# Patient Record
Sex: Female | Born: 1980 | Hispanic: Yes | Marital: Married | Smoking: Never smoker
Health system: Southern US, Community
[De-identification: ages and names within clinical notes are randomized; demographics above are authoritative.]

## PROBLEM LIST (undated history)

## (undated) DIAGNOSIS — I050 Rheumatic mitral stenosis: Secondary | ICD-10-CM

## (undated) DIAGNOSIS — R002 Palpitations: Secondary | ICD-10-CM

## (undated) DIAGNOSIS — I272 Pulmonary hypertension, unspecified: Secondary | ICD-10-CM

## (undated) DIAGNOSIS — K8020 Calculus of gallbladder without cholecystitis without obstruction: Secondary | ICD-10-CM

## (undated) HISTORY — PX: CARDIAC VALVE SURGERY: SHX40

## (undated) HISTORY — DX: Calculus of gallbladder without cholecystitis without obstruction: K80.20

## (undated) HISTORY — PX: OVARIAN CYST REMOVAL: SHX89

## (undated) HISTORY — PX: MITRAL VALVULOPLASTY: SHX143

## (undated) HISTORY — DX: Pulmonary hypertension, unspecified: I27.20

## (undated) HISTORY — DX: Rheumatic mitral stenosis: I05.0

## (undated) HISTORY — PX: CHOLECYSTECTOMY: SHX55

## (undated) HISTORY — DX: Palpitations: R00.2

---

## 2000-09-21 ENCOUNTER — Emergency Department (HOSPITAL_COMMUNITY): Admission: EM | Admit: 2000-09-21 | Discharge: 2000-09-21 | Payer: Self-pay | Admitting: Emergency Medicine

## 2001-05-22 ENCOUNTER — Inpatient Hospital Stay (HOSPITAL_COMMUNITY): Admission: EM | Admit: 2001-05-22 | Discharge: 2001-05-27 | Payer: Self-pay | Admitting: *Deleted

## 2001-05-22 ENCOUNTER — Encounter: Payer: Self-pay | Admitting: *Deleted

## 2001-05-26 ENCOUNTER — Encounter: Payer: Self-pay | Admitting: Internal Medicine

## 2001-05-27 ENCOUNTER — Encounter: Payer: Self-pay | Admitting: Internal Medicine

## 2001-06-20 ENCOUNTER — Encounter: Payer: Self-pay | Admitting: Internal Medicine

## 2001-06-20 ENCOUNTER — Inpatient Hospital Stay (HOSPITAL_COMMUNITY): Admission: EM | Admit: 2001-06-20 | Discharge: 2001-06-24 | Payer: Self-pay | Admitting: Emergency Medicine

## 2001-08-04 ENCOUNTER — Encounter: Admission: RE | Admit: 2001-08-04 | Discharge: 2001-08-04 | Payer: Self-pay | Admitting: Internal Medicine

## 2001-08-18 ENCOUNTER — Encounter: Admission: RE | Admit: 2001-08-18 | Discharge: 2001-08-18 | Payer: Self-pay | Admitting: Internal Medicine

## 2002-03-24 ENCOUNTER — Encounter: Admission: RE | Admit: 2002-03-24 | Discharge: 2002-03-24 | Payer: Self-pay | Admitting: *Deleted

## 2002-03-31 ENCOUNTER — Encounter: Admission: RE | Admit: 2002-03-31 | Discharge: 2002-03-31 | Payer: Self-pay | Admitting: *Deleted

## 2002-04-14 ENCOUNTER — Encounter: Admission: RE | Admit: 2002-04-14 | Discharge: 2002-04-14 | Payer: Self-pay | Admitting: *Deleted

## 2002-04-28 ENCOUNTER — Encounter: Admission: RE | Admit: 2002-04-28 | Discharge: 2002-04-28 | Payer: Self-pay | Admitting: *Deleted

## 2002-04-30 ENCOUNTER — Ambulatory Visit (HOSPITAL_COMMUNITY): Admission: RE | Admit: 2002-04-30 | Discharge: 2002-04-30 | Payer: Self-pay | Admitting: Internal Medicine

## 2002-05-12 ENCOUNTER — Encounter: Admission: RE | Admit: 2002-05-12 | Discharge: 2002-05-12 | Payer: Self-pay | Admitting: *Deleted

## 2002-05-26 ENCOUNTER — Encounter: Admission: RE | Admit: 2002-05-26 | Discharge: 2002-05-26 | Payer: Self-pay | Admitting: *Deleted

## 2002-06-09 ENCOUNTER — Encounter: Admission: RE | Admit: 2002-06-09 | Discharge: 2002-06-09 | Payer: Self-pay | Admitting: *Deleted

## 2002-06-23 ENCOUNTER — Encounter: Admission: RE | Admit: 2002-06-23 | Discharge: 2002-06-23 | Payer: Self-pay | Admitting: *Deleted

## 2002-07-07 ENCOUNTER — Encounter: Admission: RE | Admit: 2002-07-07 | Discharge: 2002-07-07 | Payer: Self-pay | Admitting: *Deleted

## 2002-07-14 ENCOUNTER — Encounter: Admission: RE | Admit: 2002-07-14 | Discharge: 2002-07-14 | Payer: Self-pay | Admitting: *Deleted

## 2002-07-28 ENCOUNTER — Encounter: Admission: RE | Admit: 2002-07-28 | Discharge: 2002-07-28 | Payer: Self-pay | Admitting: *Deleted

## 2002-08-05 ENCOUNTER — Encounter: Admission: RE | Admit: 2002-08-05 | Discharge: 2002-08-05 | Payer: Self-pay | Admitting: Family Medicine

## 2002-08-12 ENCOUNTER — Encounter: Admission: RE | Admit: 2002-08-12 | Discharge: 2002-08-12 | Payer: Self-pay | Admitting: Family Medicine

## 2002-08-13 ENCOUNTER — Ambulatory Visit (HOSPITAL_COMMUNITY): Admission: RE | Admit: 2002-08-13 | Discharge: 2002-08-13 | Payer: Self-pay | Admitting: Obstetrics and Gynecology

## 2002-08-19 ENCOUNTER — Encounter: Admission: RE | Admit: 2002-08-19 | Discharge: 2002-08-19 | Payer: Self-pay | Admitting: Family Medicine

## 2002-08-23 ENCOUNTER — Encounter: Admission: RE | Admit: 2002-08-23 | Discharge: 2002-11-21 | Payer: Self-pay | Admitting: *Deleted

## 2002-08-26 ENCOUNTER — Encounter: Admission: RE | Admit: 2002-08-26 | Discharge: 2002-08-26 | Payer: Self-pay | Admitting: Family Medicine

## 2002-09-02 ENCOUNTER — Encounter: Admission: RE | Admit: 2002-09-02 | Discharge: 2002-09-02 | Payer: Self-pay | Admitting: *Deleted

## 2002-09-09 ENCOUNTER — Encounter: Admission: RE | Admit: 2002-09-09 | Discharge: 2002-09-09 | Payer: Self-pay | Admitting: *Deleted

## 2002-09-16 ENCOUNTER — Encounter: Admission: RE | Admit: 2002-09-16 | Discharge: 2002-09-16 | Payer: Self-pay | Admitting: *Deleted

## 2002-09-23 ENCOUNTER — Encounter: Admission: RE | Admit: 2002-09-23 | Discharge: 2002-09-23 | Payer: Self-pay | Admitting: Family Medicine

## 2002-09-30 ENCOUNTER — Ambulatory Visit (HOSPITAL_COMMUNITY): Admission: RE | Admit: 2002-09-30 | Discharge: 2002-09-30 | Payer: Self-pay | Admitting: *Deleted

## 2002-09-30 ENCOUNTER — Encounter: Payer: Self-pay | Admitting: *Deleted

## 2002-09-30 ENCOUNTER — Inpatient Hospital Stay (HOSPITAL_COMMUNITY): Admission: AD | Admit: 2002-09-30 | Discharge: 2002-10-04 | Payer: Self-pay | Admitting: Obstetrics & Gynecology

## 2002-09-30 ENCOUNTER — Encounter: Admission: RE | Admit: 2002-09-30 | Discharge: 2002-09-30 | Payer: Self-pay | Admitting: *Deleted

## 2004-04-09 ENCOUNTER — Ambulatory Visit: Payer: Self-pay

## 2004-04-09 ENCOUNTER — Ambulatory Visit: Payer: Self-pay | Admitting: Internal Medicine

## 2004-07-05 ENCOUNTER — Inpatient Hospital Stay (HOSPITAL_COMMUNITY): Admission: AD | Admit: 2004-07-05 | Discharge: 2004-07-05 | Payer: Self-pay | Admitting: Obstetrics

## 2004-08-30 ENCOUNTER — Ambulatory Visit: Payer: Self-pay

## 2004-09-06 ENCOUNTER — Ambulatory Visit: Payer: Self-pay | Admitting: Cardiology

## 2004-09-08 ENCOUNTER — Inpatient Hospital Stay (HOSPITAL_COMMUNITY): Admission: AD | Admit: 2004-09-08 | Discharge: 2004-09-11 | Payer: Self-pay | Admitting: Obstetrics

## 2010-08-07 ENCOUNTER — Emergency Department (HOSPITAL_COMMUNITY)
Admission: EM | Admit: 2010-08-07 | Discharge: 2010-08-08 | Disposition: A | Discharge status: Home / Self Care | Payer: Self-pay | Attending: Emergency Medicine | Admitting: Emergency Medicine

## 2010-08-07 DIAGNOSIS — R002 Palpitations: Secondary | ICD-10-CM | POA: Insufficient documentation

## 2010-08-07 DIAGNOSIS — I440 Atrioventricular block, first degree: Secondary | ICD-10-CM | POA: Insufficient documentation

## 2010-08-07 DIAGNOSIS — I479 Paroxysmal tachycardia, unspecified: Secondary | ICD-10-CM | POA: Insufficient documentation

## 2010-08-07 LAB — BASIC METABOLIC PANEL
BUN: 6 mg/dL (ref 6–23)
Chloride: 103 mEq/L (ref 96–112)
Creatinine, Ser: 0.51 mg/dL (ref 0.4–1.2)
Glucose, Bld: 89 mg/dL (ref 70–99)

## 2010-08-07 LAB — POCT CARDIAC MARKERS

## 2010-08-07 LAB — CBC
MCH: 28.8 pg (ref 26.0–34.0)
MCV: 86.3 fL (ref 78.0–100.0)
Platelets: 178 10*3/uL (ref 150–400)
RBC: 4.37 MIL/uL (ref 3.87–5.11)
RDW: 13.1 % (ref 11.5–15.5)

## 2010-08-07 LAB — DIFFERENTIAL
Eosinophils Absolute: 0 10*3/uL (ref 0.0–0.7)
Eosinophils Relative: 0 % (ref 0–5)
Lymphs Abs: 2.4 10*3/uL (ref 0.7–4.0)
Monocytes Relative: 9 % (ref 3–12)

## 2010-08-08 ENCOUNTER — Encounter: Payer: Self-pay | Admitting: *Deleted

## 2010-08-08 ENCOUNTER — Encounter: Payer: Self-pay | Admitting: Cardiovascular Disease

## 2010-08-08 LAB — POCT CARDIAC MARKERS

## 2010-08-09 ENCOUNTER — Ambulatory Visit (INDEPENDENT_AMBULATORY_CARE_PROVIDER_SITE_OTHER): Payer: Self-pay | Admitting: Internal Medicine

## 2010-08-09 ENCOUNTER — Encounter: Payer: Self-pay | Admitting: Internal Medicine

## 2010-08-09 VITALS — BP 90/64 | HR 73 | Resp 18 | Ht 62.0 in | Wt 150.0 lb

## 2010-08-09 DIAGNOSIS — I050 Rheumatic mitral stenosis: Secondary | ICD-10-CM

## 2010-08-09 DIAGNOSIS — I059 Rheumatic mitral valve disease, unspecified: Secondary | ICD-10-CM

## 2010-08-09 DIAGNOSIS — I499 Cardiac arrhythmia, unspecified: Secondary | ICD-10-CM

## 2010-08-09 MED ORDER — METOPROLOL TARTRATE 25 MG PO TABS: ORAL_TABLET | ORAL | Status: DC

## 2010-08-09 NOTE — Patient Instructions (Addendum)
Your physician has requested that you have an echocardiogram. Echocardiography is a painless test that uses sound waves to create images of your heart. It provides your doctor with information about the size and shape of your heart and how well your heart's chambers and valves are working. This procedure takes approximately one hour. There are no restrictions for this procedure. We will call you with results  Take Lopressor 25mg  as needed for palpitations.  Call Dr.Ross if having to take Lopressor.

## 2010-08-12 DIAGNOSIS — I050 Rheumatic mitral stenosis: Secondary | ICD-10-CM | POA: Insufficient documentation

## 2010-08-12 DIAGNOSIS — I499 Cardiac arrhythmia, unspecified: Secondary | ICD-10-CM | POA: Insufficient documentation

## 2010-08-12 NOTE — Progress Notes (Signed)
HPI Patient is a 30 year old who was referred for evaluation of tachycardia. On Tuesday the patient took her children to the bus and came home.  Around 7:30 her heart began racing.  She became dizzy but did not get close to fainting.  No SOB.  No chest pain.  Tachcardia would come and go through the day.  She laid on the floor for most of the day.  When her husband came home she went to the urgent care and from there was sent to the emergency room. This was the first episode she had .  EKG in the Urgent care showed a narrow complex tachycardia.  By the time she got to the ER the episode had resolved. She has not had any other since Otherwise, she denies SOB.  No chest pain.  No dizziness. Note that the patient was seen remotely in clinic .  She has a history of mitral stenosis and underwent MV valvuloplasty at [facility].  She has not been seen  In f/u. No Known Allergies  Current Outpatient Prescriptions  Medication Sig Dispense Refill  . metoprolol tartrate (LOPRESSOR) 25 MG tablet Take one tablet as needed for palpitations  30 tablet  1    Past Medical History  Diagnosis Date  . Mitral stenosis   . Pulmonary hypertension     Improved following mitral valvuloplasty  . Palpitations     Past Surgical History  Procedure Date  . Mitral valvuloplasty   . Cardiac valve surgery     No family history on file.  History   Social History  . Marital Status: Married    Spouse Name: N/A    Number of Children: N/A  . Years of Education: N/A   Occupational History  . Not on file.   Social History Main Topics  . Smoking status: Never Smoker   . Smokeless tobacco: Not on file  . Alcohol Use: No  . Drug Use: No  . Sexually Active: Not on file   Other Topics Concern  . Not on file   Social History Narrative  . No narrative on file    Review of Systems:  All systems reviewed.  They are negative to the above problem except as previously stated.  Vital Signs: BP 90/64  Pulse 73  Resp  18  Ht 5\' 2"  (1.575 m)  Wt 150 lb (68.04 kg)  BMI 27.44 kg/m2  Physical Exam Patient is in NAD   HEENT:  Normocephalic, atraumatic. EOMI, PERRLA.  Neck: JVP is normal. No thyromegaly. No bruits.  Lungs: clear to auscultation. No rales no wheezes.  Heart: Regular rate and rhythm. Normal S1, S2. No S3.   No significant murmurs. PMI not displaced.No RV heave  Abdomen:  Supple, nontender. Normal bowel sounds. No masses. No hepatomegaly.  Extremities:   Good distal pulses throughout. No lower extremity edema.  Musculoskeletal :moving all extremities.  Neuro:   alert and oriented x3.  CN II-XII grossly intact.  EKG:  SR.  First degree AV block.  Possible LA abnormality. Assessment and Plan:

## 2010-08-12 NOTE — Assessment & Plan Note (Signed)
Patient s/p valuloplasty  Exam is really unremarkable.  I would recommend an echo to evaluate.

## 2010-08-12 NOTE — Assessment & Plan Note (Signed)
This is the first spell the patient had .  I have given her and rx for b blocker to use as needed for recurrence. I will review with EP.

## 2010-08-20 ENCOUNTER — Ambulatory Visit (HOSPITAL_COMMUNITY): Payer: Self-pay | Attending: Cardiology

## 2010-08-20 DIAGNOSIS — R002 Palpitations: Secondary | ICD-10-CM | POA: Insufficient documentation

## 2010-08-20 DIAGNOSIS — I059 Rheumatic mitral valve disease, unspecified: Secondary | ICD-10-CM

## 2010-08-20 DIAGNOSIS — I379 Nonrheumatic pulmonary valve disorder, unspecified: Secondary | ICD-10-CM | POA: Insufficient documentation

## 2010-08-20 DIAGNOSIS — I080 Rheumatic disorders of both mitral and aortic valves: Secondary | ICD-10-CM | POA: Insufficient documentation

## 2010-08-20 DIAGNOSIS — I079 Rheumatic tricuspid valve disease, unspecified: Secondary | ICD-10-CM | POA: Insufficient documentation

## 2010-08-20 DIAGNOSIS — R000 Tachycardia, unspecified: Secondary | ICD-10-CM | POA: Insufficient documentation

## 2010-08-20 DIAGNOSIS — I050 Rheumatic mitral stenosis: Secondary | ICD-10-CM

## 2010-08-30 ENCOUNTER — Encounter: Payer: Self-pay | Admitting: Internal Medicine

## 2012-05-18 ENCOUNTER — Other Ambulatory Visit (HOSPITAL_COMMUNITY): Payer: Self-pay | Admitting: Nurse Practitioner

## 2012-05-18 DIAGNOSIS — Z3689 Encounter for other specified antenatal screening: Secondary | ICD-10-CM

## 2012-05-18 LAB — CYTOLOGY - PAP: Pap: NEGATIVE

## 2012-05-18 LAB — OB RESULTS CONSOLE HIV ANTIBODY (ROUTINE TESTING): HIV: NONREACTIVE

## 2012-05-18 LAB — OB RESULTS CONSOLE HGB/HCT, BLOOD
HCT: 38 %
Hemoglobin: 12.8 g/dL

## 2012-05-18 LAB — OB RESULTS CONSOLE HEPATITIS B SURFACE ANTIGEN: Hepatitis B Surface Ag: NEGATIVE

## 2012-05-18 LAB — OB RESULTS CONSOLE RPR: RPR: NONREACTIVE

## 2012-05-18 LAB — CULTURE, OB URINE: Urine Culture, OB: NEGATIVE

## 2012-05-18 LAB — OB RESULTS CONSOLE ANTIBODY SCREEN: Antibody Screen: NEGATIVE

## 2012-05-18 LAB — OB RESULTS CONSOLE GC/CHLAMYDIA: Gonorrhea: POSITIVE

## 2012-05-20 ENCOUNTER — Ambulatory Visit (HOSPITAL_COMMUNITY)
Admission: RE | Admit: 2012-05-20 | Discharge: 2012-05-20 | Disposition: A | Discharge status: Home / Self Care | Payer: Self-pay | Source: Ambulatory Visit | Attending: Nurse Practitioner | Admitting: Nurse Practitioner

## 2012-05-20 ENCOUNTER — Encounter (HOSPITAL_COMMUNITY): Payer: Self-pay

## 2012-05-20 DIAGNOSIS — Z3689 Encounter for other specified antenatal screening: Secondary | ICD-10-CM

## 2012-05-20 DIAGNOSIS — Z1389 Encounter for screening for other disorder: Secondary | ICD-10-CM | POA: Insufficient documentation

## 2012-05-20 DIAGNOSIS — Z363 Encounter for antenatal screening for malformations: Secondary | ICD-10-CM | POA: Insufficient documentation

## 2012-05-20 DIAGNOSIS — O358XX0 Maternal care for other (suspected) fetal abnormality and damage, not applicable or unspecified: Secondary | ICD-10-CM | POA: Insufficient documentation

## 2012-05-21 LAB — GLUCOSE TOLERANCE, 3 HOURS
Glucose, GTT - 2 Hour: 142 mg/dL — AB (ref ?–140)
Glucose, GTT - 3 Hour: 126 mg/dL (ref ?–140)
Glucose, GTT - Fasting: 80 mg/dL (ref 80–110)

## 2012-05-28 ENCOUNTER — Ambulatory Visit (INDEPENDENT_AMBULATORY_CARE_PROVIDER_SITE_OTHER): Payer: Self-pay | Admitting: Family Medicine

## 2012-05-28 ENCOUNTER — Encounter: Payer: Self-pay | Admitting: Family Medicine

## 2012-05-28 VITALS — BP 110/71 | Temp 97.0°F | Wt 162.0 lb

## 2012-05-28 DIAGNOSIS — O98212 Gonorrhea complicating pregnancy, second trimester: Secondary | ICD-10-CM | POA: Insufficient documentation

## 2012-05-28 DIAGNOSIS — D696 Thrombocytopenia, unspecified: Secondary | ICD-10-CM | POA: Insufficient documentation

## 2012-05-28 DIAGNOSIS — O09299 Supervision of pregnancy with other poor reproductive or obstetric history, unspecified trimester: Secondary | ICD-10-CM

## 2012-05-28 DIAGNOSIS — I499 Cardiac arrhythmia, unspecified: Secondary | ICD-10-CM

## 2012-05-28 DIAGNOSIS — N200 Calculus of kidney: Secondary | ICD-10-CM | POA: Insufficient documentation

## 2012-05-28 DIAGNOSIS — O98219 Gonorrhea complicating pregnancy, unspecified trimester: Secondary | ICD-10-CM

## 2012-05-28 DIAGNOSIS — Z8632 Personal history of gestational diabetes: Secondary | ICD-10-CM | POA: Insufficient documentation

## 2012-05-28 DIAGNOSIS — O99419 Diseases of the circulatory system complicating pregnancy, unspecified trimester: Secondary | ICD-10-CM

## 2012-05-28 DIAGNOSIS — Z8679 Personal history of other diseases of the circulatory system: Secondary | ICD-10-CM | POA: Insufficient documentation

## 2012-05-28 DIAGNOSIS — Z23 Encounter for immunization: Secondary | ICD-10-CM

## 2012-05-28 DIAGNOSIS — O0990 Supervision of high risk pregnancy, unspecified, unspecified trimester: Secondary | ICD-10-CM | POA: Insufficient documentation

## 2012-05-28 DIAGNOSIS — I050 Rheumatic mitral stenosis: Secondary | ICD-10-CM

## 2012-05-28 LAB — POCT URINALYSIS DIP (DEVICE)
Bilirubin Urine: NEGATIVE
Glucose, UA: NEGATIVE mg/dL
Hgb urine dipstick: NEGATIVE
Ketones, ur: NEGATIVE mg/dL
Specific Gravity, Urine: <=1.005 (ref 1.005–1.030)

## 2012-05-28 MED ORDER — TETANUS-DIPHTH-ACELL PERTUSSIS 5-2.5-18.5 LF-MCG/0.5 IM SUSP
0.5000 mL | Freq: Once | INTRAMUSCULAR | Status: DC
Start: 2012-05-28 — End: 2012-05-28

## 2012-05-28 NOTE — Progress Notes (Signed)
MFM consult scheduled 06/03/12 at 930 am. Cardiology appt. With Dr. Tenny Craw at [facility] scheduled 06/05/12 at 1130 am.

## 2012-05-28 NOTE — Progress Notes (Signed)
Nutrition note: 1st visit consult Pt has gained 3# @ [redacted]w[redacted]d, which is < expected. Pt reports eating 3 meals & 0-1 snacks/d. Pt reports no N&V or heartburn. Pt is taking a PNV. Pt received verbal & written education on general nutrition during pregnancy. Encouraged energy dense snacks. Disc wt gain goals of 15-25# or 0.6#/wk. Pt agrees to cont PNV & try to eat more often. Pt does not have WIC but plans to apply. Pt plans to BF. F/u in 4-6 wks Regina Reveal, MS, RD, LDN

## 2012-05-28 NOTE — Progress Notes (Signed)
S/p Flu in HD- New pt. Transferred from HD.  Needs TDaP Discussed risk of MS in pregnancy--she understands.  Risk is high at delivery--for MFM to see--consider transfer of care for delivery Cards referral--has not seen them since last year.  Supposed to have an ECHO 1/14.  Needs to apply for charity care in [facility].

## 2012-05-28 NOTE — Patient Instructions (Signed)
Embarazo - Segundo trimestre (Pregnancy - Second Trimester) El segundo trimestre del embarazo (del 3 al 6mes) es un perodo de evolucin rpida para usted y el beb. Hacia el final del sexto mes, el beb mide aproximadamente 23 cm y pesa 680 g. Comenzar a sentir los movimientos del beb entre las 18 y las 20 semanas de embarazo. Podr sentir las pataditas ("quickening en ingls"). Hay un rpido aumento de peso. Puede segregar un lquido claro (calostro) de las mamas. Quizs sienta pequeas contracciones en el vientre (tero) Esto se conoce como falso trabajo de parto o contracciones de Braxton-Hicks. Es como una prctica del trabajo de parto que se produce cuando el beb est listo para salir. Generalmente los problemas de vmitos matinales ya se han superado hacia el final del primer trimestre. Algunas mujeres desarrollan pequeas manchas oscuras (que se denominan cloasma, mscara del embarazo) en la cara que normalmente se van luego del nacimiento del beb. La exposicin al sol empeora las manchas. Puede desarrollarse acn en algunas mujeres embarazadas, y puede desaparecer en aquellas que ya tienen acn. EXAMENES PRENATALES  Durante los exmenes prenatales, deber seguir realizando pruebas de sangre, segn avance el embarazo. Estas pruebas se realizan para controlar su salud y la del beb. Tambin se realizan anlisis de sangre para conocer los niveles de hemoglobina. La anemia (bajo nivel de hemoglobina) es frecuente durante el embarazo. Para prevenirla, se administran hierro y vitaminas. Tambin se le realizarn exmenes para saber si tiene diabetes entre las 24 y las 28 semanas del embarazo. Podrn repetirle algunas de las pruebas que le hicieron previamente.  En cada visita le medirn el tamao del tero. Esto se realiza para asegurarse de que el beb est creciendo correctamente de acuerdo al estado del embarazo.  Tambin en cada visita prenatal controlarn su presin arterial. Esto se realiza  para asegurarse de que no tenga toxemia.  Se controlar su orina para asegurarse de que no tenga infecciones, diabetes o protena en la orina.  Se controlar su peso regularmente para asegurarse que el aumento ocurre al ritmo indicado. Esto se hace para asegurarse que usted y el beb tienen una evolucin normal.  En algunas ocasiones se realiza una prueba de ultrasonido para confirmar el correcto desarrollo y evolucin del beb. Esta prueba se realiza con ondas sonoras inofensivas para el beb, de modo que el profesional pueda calcular ms precisamente la fecha del parto. Algunas veces se realizan pruebas especializadas del lquido amnitico que rodea al beb. Esta prueba se denomina amniocentesis. El lquido amnitico se obtiene introduciendo una aguja en el vientre (abdomen). Se realiza para controlar los cromosomas en aquellos casos en los que existe alguna preocupacin acerca de algn problema gentico que pueda sufrir el beb. En ocasiones se lleva a cabo cerca del final del embarazo, si es necesario inducir al parto. En este caso se realiza para asegurarse que los pulmones del beb estn lo suficientemente maduros como para que pueda vivir fuera del tero. CAMBIOS QUE OCURREN EN EL SEGUNDO TRIMESTRE DEL EMBARAZO Su organismo atravesar numerosos cambios durante el embarazo. Estos pueden variar de una persona a otra. Converse con el profesional que la asiste acerca los cambios que usted note y que la preocupen.  Durante el segundo trimestre probablemente sienta un aumento del apetito. Es normal tener "antojos" de ciertas comidas. Esto vara de una persona a otra y de un embarazo a otro.  El abdomen inferior comenzar a abultarse.  Podr tener la necesidad de orinar con ms frecuencia debido a que   el tero y el beb presionan sobre la vejiga. Tambin es frecuente contraer ms infecciones urinarias durante el embarazo (dolor al orinar). Puede evitarlas bebiendo gran cantidad de lquidos y vaciando  la vejiga antes y despus de mantener relaciones sexuales.  Podrn aparecer las primeras estras en las caderas, abdomen y mamas. Estos son cambios normales del cuerpo durante el embarazo. No existen medicamentos ni ejercicios que puedan prevenir estos cambios.  Es posible que comience a desarrollar venas inflamadas y abultadas (varices) en las piernas. El uso de medias de descanso, elevar sus pies durante 15 minutos, 3 a 4 veces al da y limitar la sal en su dieta ayuda a aliviar el problema.  Podr sentir acidez gstrica a medida que el tero crece y presiona contra el estmago. Puede tomar anticidos, con la autorizacin de su mdico, para aliviar este problema. Tambin es til ingerir pequeas comidas 4 a 5 veces al da.  La constipacin puede tratarse con un laxante o agregando fibra a su dieta. Beber grandes cantidades de lquidos, comer vegetales, frutas y granos integrales es de gran ayuda.  Tambin es beneficioso practicar actividad fsica. Si ha sido una persona activa hasta el embarazo, podr continuar con la mayora de las actividades durante el mismo. Si ha sido menos activa, puede ser beneficioso que comience con un programa de ejercicios, como realizar caminatas.  Puede desarrollar hemorroides (vrices en el recto) hacia el final del segundo trimestre. Tomar baos de asiento tibios y utilizar cremas recomendadas por el profesional que lo asiste sern de ayuda para los problemas de hemorroides.  Tambin podr sentir dolor de espalda durante este momento de su embarazo. Evite levantar objetos pesados, utilice zapatos de taco bajo y mantenga una buena postura para ayudar a reducir los problemas de espalda.  Algunas mujeres embarazadas desarrollan hormigueo y adormecimiento de la mano y los dedos debido a la hinchazn y compresin de los ligamentos de la mueca (sndrome del tnel carpiano). Esto desaparece una vez que el beb nace.  Como sus pechos se agrandan, necesitar un sujetador  ms grande. Use un sostn de soporte, cmodo y de algodn. No utilice un sostn para amamantar hasta el ltimo mes de embarazo si va a amamantar al beb.  Podr observar una lnea oscura desde el ombligo hacia la zona pbica denominada linea nigra.  Podr observar que sus mejillas se ponen coloradas debido al aumento de flujo sanguneo en la cara.  Podr desarrollar "araitas" en la cara, cuello y pecho. Esto desaparece una vez que el beb nace. INSTRUCCIONES PARA EL CUIDADO DOMICILIARIO  Es extremadamente importante que evite el cigarrillo, hierbas medicinales, alcohol y las drogas no prescriptas durante el embarazo. Estas sustancias qumicas afectan la formacin y el desarrollo del beb. Evite estas sustancias durante todo el embarazo para asegurar el nacimiento de un beb sano.  La mayor parte de los cuidados que se aconsejan son los mismos que los indicados para el primer trimestre del embarazo. Cumpla con las citas tal como se le indic. Siga las instrucciones del profesional que lo asiste con respecto al uso de los medicamentos, el ejercicio y la dieta.  Durante el embarazo debe obtener nutrientes para usted y para su beb. Consuma alimentos balanceados a intervalos regulares. Elija alimentos como carne, pescado, leche y otros productos lcteos descremados, vegetales, frutas, panes integrales y cereales. El profesional le informar cul es el aumento de peso ideal.  Las relaciones sexuales fsicas pueden continuarse hasta cerca del fin del embarazo si no existen otros problemas. Estos   problemas pueden ser la prdida temprana (prematura) de lquido amnitico de las membranas, sangrado vaginal, dolor abdominal u otros problemas mdicos o del embarazo.  Realice actividad fsica todos los das, si no tiene restricciones. Consulte con el profesional que la asiste si no sabe con certeza si determinados ejercicios son seguros. El mayor aumento de peso tiene lugar durante los ltimos 2 trimestres del  embarazo. El ejercicio la ayudar a:  Controlar su peso.  Ponerla en forma para el parto.  Ayudarla a perder peso luego de haber dado a luz.  Use un buen sostn o como los que se usan para hacer deportes para aliviar la sensibilidad de las mamas. Tambin puede serle til si lo usa mientras duerme. Si pierde calostro, podr utilizar apsitos en el sostn.  No utilice la baera con agua caliente, baos turcos y saunas durante el embarazo.  Utilice el cinturn de seguridad sin excepcin cuando conduzca. Este la proteger a usted y al beb en caso de accidente.  Evite comer carne cruda, queso crudo, y el contacto con los utensilios y desperdicios de los gatos. Estos elementos contienen grmenes que pueden causar defectos de nacimiento en el beb.  El segundo trimestre es un buen momento para visitar a su dentista y evaluar su salud dental si an no lo ha hecho. Es importante mantener los dientes limpios. Utilice un cepillo de dientes blando. Cepllese ms suavemente durante el embarazo.  Es ms fcil perder algo de orina durante el embarazo. Apretar y fortalecer los msculos de la pelvis la ayudar con este problema. Practique detener la miccin cuando est en el bao. Estos son los mismos msculos que necesita fortalecer. Son tambin los mismos msculos que utiliza cuando trata de evitar los gases. Puede practicar apretando estos msculos 10 veces, y repetir esto 3 veces por da aproximadamente. Una vez que conozca qu msculos debe apretar, no realice estos ejercicios durante la miccin. Puede favorecerle una infeccin si la orina vuelve hacia atrs.  Pida ayuda si tiene necesidades econmicas, de asesoramiento o nutricionales durante el embarazo. El profesional podr ayudarla con respecto a estas necesidades, o derivarla a otros especialistas.  La piel puede ponerse grasa. Si esto sucede, lvese la cara con un jabn suave, utilice un humectante no graso y maquillaje con base de aceite o  crema. CONSUMO DE MEDICAMENTOS Y DROGAS DURANTE EL EMBARAZO  Contine tomando las vitaminas apropiadas para esta etapa tal como se le indic. Las vitaminas deben contener un miligramo de cido flico y deben suplementarse con hierro. Guarde todas las vitaminas fuera del alcance de los nios. La ingestin de slo un par de vitaminas o tabletas que contengan hierro puede ocasionar la muerte en un beb o en un nio pequeo.  Evite el uso de medicamentos, inclusive los de venta libre y hierbas que no hayan sido prescriptos o indicados por el profesional que la asiste. Algunos medicamentos pueden causar problemas fsicos al beb. Utilice los medicamentos de venta libre o de prescripcin para el dolor, el malestar o la fiebre, segn se lo indique el profesional que lo asiste. No utilice aspirina.  El consumo de alcohol est relacionado con ciertos defectos de nacimiento. Esto incluye el sndrome de alcoholismo fetal. Debe evitar el consumo de alcohol en cualquiera de sus formas. El cigarrillo causa nacimientos prematuros y bebs de bajo peso. El uso de drogas recreativas est absolutamente prohibido. Son muy nocivas para el beb. Un beb que nace de una madre adicta, ser adicto al nacer. Ese beb tendr los mismos   sntomas de abstinencia que un adulto.  Infrmele al profesional si consume alguna droga.  No consuma drogas ilegales. Pueden causarle mucho dao al beb. SOLICITE ATENCIN MDICA SI: Tiene preguntas o preocupaciones durante su embarazo. Es mejor que llame para consultar las dudas que esperar hasta su prxima visita prenatal. De esta forma se sentir ms tranquila.  SOLICITE ATENCIN MDICA DE INMEDIATO SI:  La temperatura oral se eleva sin motivo por encima de 102 F (38.9 C) o segn le indique el profesional que lo asiste.  Tiene una prdida de lquido por la vagina (canal de parto). Si sospecha una ruptura de las membranas, tmese la temperatura y llame al profesional para informarlo sobre  esto.  Observa unas pequeas manchas, una hemorragia vaginal o elimina cogulos. Notifique al profesional acerca de la cantidad y de cuntos apsitos est utilizando. Unas pequeas manchas de sangre son algo comn durante el embarazo, especialmente despus de mantener relaciones sexuales.  Presenta un olor desagradable en la secrecin vaginal y observa un cambio en el color, de transparente a blanco.  Contina con las nuseas y no obtiene alivio de los remedios indicados. Vomita sangre o algo similar a la borra del caf.  [place] o sube ms de 900 g. en una semana, o segn lo indicado por el profesional que la asiste.  Observa que se le hinchan el rostro, las manos, los pies o las piernas.  Ha estado expuesta a la rubola y no ha sufrido la enfermedad.  Ha estado expuesta a la quinta enfermedad o a la varicela.  Presenta dolor abdominal. Las molestias en el ligamento redondo son una causa no cancerosa (benigna) frecuente de dolor abdominal durante el embarazo. El profesional que la asiste deber evaluarla.  Presenta dolor de cabeza intenso que no se alivia.  Presenta fiebre, diarrea, dolor al orinar o le falta la respiracin.  Presenta dificultad para ver, visin borrosa, o visin doble.  Sufre una cada, un accidente de trnsito o cualquier tipo de trauma.  Vive en un hogar en el que existe violencia fsica o mental. Document Released: 12/26/2004 Document Revised: 06/10/2011 [employer] Patient Information 2013 [employer].  Lactancia materna  (Breastfeeding) Decidir amamantar es una de las mejores elecciones que puede hacer por usted y su beb. La informacin que se brinda a continuacin le dar una breve visin de los beneficios de la lactancia materna as como de las dudas ms frecuentes alrededor de ella.  LOS BENEFICIOS DE AMAMANTAR  Para el beb   La primera leche (calostro ) ayuda al mejor funcionamiento del sistema digestivo del beb.   La leche tiene anticuerpos que  provienen de la madre y que ayudan a prevenir las infecciones en el beb.   El beb tiene una menor incidencia de asma, alergias y del sndrome de muerte sbita del lactante (SMSL).   Los nutrientes en la leche materna son mejores para el beb que los preparados para lactantes y la leche materna ayuda a un mejor desarrollo del cerebro del beb.   Los bebs amamantados tienen menos gases, clicos y estreimiento.  Para la mam   La lactancia materna favorece el desarrollo de un vnculo muy especial entre la madre y el beb.   Es ms conveniente, siempre disponible y a la temperatura adecuada y econmico.   Consume caloras en la madre y la ayuda a perder el peso ganado durante el embarazo.   Favorece la contraccin del tero a su tamao normal, de manera ms rpida y disminuye las hemorragias luego del   parto.   Las madres que amamantan tienen menor riesgo de desarrollar cncer de mama.  FRECUENCIA DEL AMAMANTAMIENTO   Un beb sano, nacido a trmino, puede amamantarse con tanta frecuencia como cada hora, o espaciar las comidas cada tres horas.   Observe al beb cuando manifieste signos de hambre. Amamante a su beb si muestra signos de hambre. Esta frecuencia variar de un beb a otro.   Amamntelo tan seguido como el beb lo solicite, o cuando usted sienta la necesidad de aliviar sus mamas.   Despierte al beb si han pasado 3  4 horas desde la ltima comida.   El amamantamiento frecuente la ayudar a producir ms leche y a prevenir problemas de dolor en los pezones e hinchazn de las mamas.  POSICIN DEL BEBE PARA EL AMAMANTAMIENTO   Ya sea que se encuentre acostada o sentada, asegrese que el abdomen del beb enfrente el suyo.   Sostenga la mama con el pulgar por arriba y los otros 4 dedos por debajo. Asegrese que sus dedos se encuentren lejos del pezn y de la boca del beb.   Empuje suavemente los labios del beb con el pezn o con el dedo.   Cuando la boca  del beb se abra lo suficiente, introduzca el pezn y la areola tanto como le sea posible dentro de la boca.   Coloque al beb cerca suyo de modo que su nariz y mejillas toquen las mamas al mamar.  ALIMENTACIN Y SUCCIN   La duracin de cada comida vara de un beb a otro y de una comida a otra.   El beb debe succionar entre 2 y 3 minutos para que le llegue leche. Esto se denomina "bajada". Por este motivo, permita que el nio se alimente en cada mama todo lo que desee. Terminar de mamar cuando haya recibido la cantidad adecuada de nutrientes.   Para detener la succin coloque su dedo en la comisura de la boca del nio y deslcelo entre sus encas antes de quitarle la mama de la boca. Esto la ayudar a evitar el dolor en los pezones.  COMO SABER SI EL BEB OBTIENE LA SUFICIENTE LECHE MATERNA  Preguntarse si el beb obtiene la cantidad suficiente de leche es una preocupacin frecuente entre las madres. Puede asegurarse que el beb tiene la leche suficiente si:   El beb succiona activamente y usted escucha que traga .   El beb parece estar relajado y satisfecho despus de mamar.   El nio se alimenta al menos 8 a 12 veces en 24 horas. Alimntelo hasta que se desprenda por sus propios medios o se quede dormido en la primera mama (al menos durante 10 a 20 minutos), luego ofrzcale el otro lado.   El beb moja 5 a 6 paales desechables (6 a 8 paales de tela) en 24 horas cuando tiene 5  6 das de vida.   Tiene al menos 3 a 4 deposiciones todos los das en los primeros meses. La materia fecal debe ser blanda y amarillenta.   El beb debe aumentar 4 a 6 libras (120 a 170 gr.) por semana despus de los 4 das de vida.   Siente que las mamas se ablandan despus de amamantar  REDUCIR LA CONGESTIN DE LAS MAMAS   Durante la primera semana despus del parto, usted puede experimentar hinchazn en las mamas. Cuando las mamas estn congestionadas, se sienten calientes, llenas y  molestas al tacto. Puede reducir la congestin si:   Lo amamanta frecuentemente, cada 2-3   horas. Las mams que amamantan pronto y con frecuencia tienen menos problemas de congestin.   Coloque compresas de hielo en sus mamas durante 10-20 minutos entre cada amamantamiento. Esto ayuda a reducir la hinchazn. Envuelva las bolsas de hielo en una toalla liviana para proteger su piel. Las bolsas de vegetales congelados funcionan bien para este propsito.   Tome una ducha tibia o aplique compresas hmedas calientes en las mamas durante 5 a 10 minutos antes de cada vez que amamanta. Esto aumenta la circulacin y ayuda a que la leche fluya.   Masajee suavemente la mama antes y durante la alimentacin. Con las puntas de los dedos, masajee desde la pared torcica hacia abajo hasta llegar al pezn, con movimientos circulares.   Asegrese que el nio vaca al menos una mama antes de cambiar de lado.   Use un sacaleche para vaciar la mama si el beb se duerme o no se alimenta bien. Tambin podr quitarse la leche con esa bomba si tiene que volver al trabajo o siente que las mamas estn congestionadas.   Evite los biberones, chupetes o complementar la alimentacin con agua o jugos en lugar de la leche materna. La leche materna es todo el alimento que el beb necesita. No es necesario que el nio ingiera agua o preparados de bibern. De hecho, es lo mejor para ayudar a que las mamas produzcan ms leche. no darle suplementos al nio durante las primeras semanas.   Verifique que el beb se encuentra en la posicin correcta mientras lo alimenta.   Use un sostn que soporte bien sus mamas y evite los que tienen aro.   Consuma una dieta balanceada y beba lquidos en cantidad.   Descanse con frecuencia, reljese y tome sus vitaminas prenatales para evitar la fatiga, el estrs y la anemia.  Si sigue estas indicaciones, la congestin debe mejorar en 24 a 48 horas. Si an tiene dificultades, consulte a su  asesor en lactancia.  CUDESE USTED MISMA  Cuide sus mamas.   Bese o dchese diariamente.   Evite usar jabn en los pezones.   Comience a amamantar del lado izquierdo en una comida y del lado derecho en la siguiente.   Notar que aumenta el flujo de leche a los 2 a 5 das despus del parto. Puede sentir algunas molestias por la congestin, lo que hace que sus mamas estn duras y sensibles. La congestin disminuye en 24 a 48 horas. Mientras tanto, aplique toallas hmedas calientes durante 5 a 10 minutos antes de amamantar. Un masaje suave y la extraccin de un poco de leche antes de amamantar ablandarn las mamas y har ms fcil que el beb se agarre.   Use un buen sostn y seque al aire los pezones durante 3 a 4 minutos luego de cada alimentacin.   Solo utilice apsitos de algodn.   Utilice lanolina pura sobre los pezones luego de amamantar. No necesita lavarlos luego de alimentar al nio. Otra opcin es exprimir algunas gotas de leche y masajear suavemente los pezones.  Cumpla con estos cuidados   Consuma alimentos bien balanceados y refrigerios nutritivos.   Beba leche, jugos de fruta y agua para satisfacer la sed (alrededor de 8 vasos por da).   Descanse lo suficiente.  Evite los alimentos que usted note que pueden afectar al beb.  SOLICITE ATENCIN MDICA SI:   Tiene dificultad con la lactancia materna y necesita ayuda.   Tiene una zona de color rojo, dura y dolorosa en la mama que se acompaa   de fiebre.   El beb est muy somnoliento como para alimentarse bien o tiene problemas para dormir.   Su beb moja menos de 6 paales al da, a los 5 das de vida.   La piel del beb o la parte blanca de sus ojos est ms amarilla de lo que estaba en el hospital.   Se siente deprimida.  Document Released: 03/18/2005 Document Revised: 09/17/2011 [employer] Patient Information 2013 [employer].  

## 2012-05-28 NOTE — Progress Notes (Signed)
Pulse- 95  Pain-lower abd when walk a lot New ob packet given Weight gain of 25-35lb Flu vaccine at [facility] Tdap vaccine consented

## 2012-05-29 ENCOUNTER — Encounter: Payer: Self-pay | Admitting: *Deleted

## 2012-05-29 DIAGNOSIS — O99419 Diseases of the circulatory system complicating pregnancy, unspecified trimester: Secondary | ICD-10-CM

## 2012-05-29 DIAGNOSIS — I2510 Atherosclerotic heart disease of native coronary artery without angina pectoris: Secondary | ICD-10-CM | POA: Insufficient documentation

## 2012-06-03 ENCOUNTER — Encounter (HOSPITAL_COMMUNITY): Payer: Self-pay

## 2012-06-05 ENCOUNTER — Ambulatory Visit: Payer: Self-pay | Admitting: Internal Medicine

## 2012-06-09 ENCOUNTER — Ambulatory Visit (HOSPITAL_COMMUNITY)
Admission: RE | Admit: 2012-06-09 | Discharge: 2012-06-09 | Disposition: A | Discharge status: Home / Self Care | Payer: Self-pay | Source: Ambulatory Visit | Attending: Obstetrics & Gynecology | Admitting: Obstetrics & Gynecology

## 2012-06-09 ENCOUNTER — Encounter (HOSPITAL_COMMUNITY): Payer: Self-pay

## 2012-06-09 VITALS — BP 107/60 | HR 100 | Wt 165.5 lb

## 2012-06-09 DIAGNOSIS — I050 Rheumatic mitral stenosis: Secondary | ICD-10-CM | POA: Insufficient documentation

## 2012-06-09 DIAGNOSIS — I2789 Other specified pulmonary heart diseases: Secondary | ICD-10-CM | POA: Insufficient documentation

## 2012-06-09 DIAGNOSIS — I2510 Atherosclerotic heart disease of native coronary artery without angina pectoris: Secondary | ICD-10-CM | POA: Insufficient documentation

## 2012-06-09 DIAGNOSIS — O99419 Diseases of the circulatory system complicating pregnancy, unspecified trimester: Secondary | ICD-10-CM | POA: Insufficient documentation
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
## 2012-06-11 ENCOUNTER — Other Ambulatory Visit: Payer: Self-pay | Admitting: Obstetrics & Gynecology

## 2012-06-11 ENCOUNTER — Encounter: Payer: Self-pay | Admitting: Family Medicine

## 2012-06-11 ENCOUNTER — Ambulatory Visit (INDEPENDENT_AMBULATORY_CARE_PROVIDER_SITE_OTHER): Payer: Self-pay | Admitting: Obstetrics & Gynecology

## 2012-06-11 ENCOUNTER — Encounter: Payer: Self-pay | Admitting: Internal Medicine

## 2012-06-11 ENCOUNTER — Ambulatory Visit (INDEPENDENT_AMBULATORY_CARE_PROVIDER_SITE_OTHER): Payer: Self-pay | Admitting: Internal Medicine

## 2012-06-11 VITALS — BP 103/56 | Temp 97.6°F | Wt 164.0 lb

## 2012-06-11 VITALS — BP 106/64 | HR 80 | Ht 60.0 in | Wt 165.0 lb

## 2012-06-11 DIAGNOSIS — I2510 Atherosclerotic heart disease of native coronary artery without angina pectoris: Secondary | ICD-10-CM

## 2012-06-11 DIAGNOSIS — I519 Heart disease, unspecified: Secondary | ICD-10-CM | POA: Insufficient documentation

## 2012-06-11 DIAGNOSIS — Z8679 Personal history of other diseases of the circulatory system: Secondary | ICD-10-CM

## 2012-06-11 DIAGNOSIS — I050 Rheumatic mitral stenosis: Secondary | ICD-10-CM

## 2012-06-11 DIAGNOSIS — O98219 Gonorrhea complicating pregnancy, unspecified trimester: Secondary | ICD-10-CM

## 2012-06-11 DIAGNOSIS — I679 Cerebrovascular disease, unspecified: Secondary | ICD-10-CM

## 2012-06-11 DIAGNOSIS — O98212 Gonorrhea complicating pregnancy, second trimester: Secondary | ICD-10-CM

## 2012-06-11 LAB — POCT URINALYSIS DIP (DEVICE)
Bilirubin Urine: NEGATIVE
Glucose, UA: NEGATIVE mg/dL
Hgb urine dipstick: NEGATIVE
Ketones, ur: NEGATIVE mg/dL
pH: 6.5 (ref 5.0–8.0)

## 2012-06-11 NOTE — Progress Notes (Signed)
HPI Patinet is a 32 yo with a history of MV stenosis (s/p valvuloplasty at [facility]), SVT (2012, self limited)  I saw her once in clnic in 2012.  Echo after showed moderate MS> She presents today for continued f/u  She is now [redacted] wks pregnant (3rd pregnancy.  Other children are 68 and 60 yrs old) She denies CP  Breathing is OK  No episodes of SVT since first time. Fetal echo done today was reportedly normal. No Known Allergies  Current Outpatient Prescriptions  Medication Sig Dispense Refill  . prenatal vitamin w/FE, FA (PRENATAL 1 + 1) 27-1 MG TABS Take 1 tablet by mouth daily.       No current facility-administered medications for this visit.    Past Medical History  Diagnosis Date  . Mitral stenosis   . Pulmonary hypertension     Improved following mitral valvuloplasty  . Palpitations     Past Surgical History  Procedure Laterality Date  . Mitral valvuloplasty    . Cardiac valve surgery      Family History  Problem Relation Age of Onset  . Diabetes Mother   . Diabetes Father     History   Social History  . Marital Status: Married    Spouse Name: N/A    Number of Children: N/A  . Years of Education: N/A   Occupational History  . Not on file.   Social History Main Topics  . Smoking status: Never Smoker   . Smokeless tobacco: Never Used  . Alcohol Use: No  . Drug Use: No  . Sexually Active: Not on file   Other Topics Concern  . Not on file   Social History Narrative  . No narrative on file    Review of Systems:  All systems reviewed.  They are negative to the above problem except as previously stated.  Vital Signs: Ht 5' (1.524 m)  Wt 165 lb (74.844 kg)  BMI 32.22 kg/m2  LMP 12/18/2011 BP 106/64  P 89  Wt 165 Physical Exam Patient is in NAD HEENT:  Normocephalic, atraumatic. EOMI, PERRLA.  Neck: JVP is normal.  No bruits.  Lungs: clear to auscultation. No rales no wheezes.  Heart: Regular rate and rhythm. Normal S1, S2. No S3.   No significant  murmurs.  RV impulse prominent in subxyhoid region.  Abdomen:  No hepatomegaly.  Distended  Extremities:   Good distal pulses throughout. No lower extremity edema.  Musculoskeletal :moving all extremities.  Neuro:   alert and oriented x3.  CN II-XII grossly intact.   EKG  SR 80 bpm.  LAA.  RAA Assessment and Plan:  1.  Mitral stenosis.  Would recomm echo to reevaluate valve and LV/RV function  Cannot make further recomm re pregnancy/delivery without info.  2.  SVT  No recurrence.  Follow.

## 2012-06-11 NOTE — Patient Instructions (Signed)
Return to clinic for any obstetric concerns or go to [facility] for evaluation. Go to [facility] for any cardiac concerns.

## 2012-06-11 NOTE — Patient Instructions (Addendum)
Your physician has requested that you have an echocardiogram. Echocardiography is a painless test that uses sound waves to create images of your heart. It provides your doctor with information about the size and shape of your heart and how well your heart's chambers and valves are working. This procedure takes approximately one hour. There are no restrictions for this procedure.   

## 2012-06-11 NOTE — Progress Notes (Signed)
Discused recommendations by Dr. Vincenza Hews (Maternal Fetal Medicine) given her severe mitral valve stenosis, and increased morbidity and mortality, need for Cardiology follow up and need for delivery at a tertiary institution with a cardiac ICU.  Please see her letter for more details. Patient wants to continue prenatal visits here and MFM will co-manage; with plan to deliver at [facility]. Denies any cardiac symptoms.  No other complaints or concerns.  Fetal movement and labor precautions reviewed. Urine GC/Chlam done for TOC.

## 2012-06-12 ENCOUNTER — Encounter: Payer: Self-pay | Admitting: *Deleted

## 2012-06-12 LAB — GC/CHLAMYDIA PROBE AMP, URINE: GC Probe Amp, Urine: NEGATIVE

## 2012-06-19 ENCOUNTER — Ambulatory Visit (HOSPITAL_COMMUNITY): Payer: Self-pay | Attending: Cardiology | Admitting: Radiology

## 2012-06-19 DIAGNOSIS — I059 Rheumatic mitral valve disease, unspecified: Secondary | ICD-10-CM

## 2012-06-19 DIAGNOSIS — I050 Rheumatic mitral stenosis: Secondary | ICD-10-CM

## 2012-06-19 DIAGNOSIS — I270 Primary pulmonary hypertension: Secondary | ICD-10-CM | POA: Insufficient documentation

## 2012-06-19 DIAGNOSIS — I079 Rheumatic tricuspid valve disease, unspecified: Secondary | ICD-10-CM | POA: Insufficient documentation

## 2012-06-19 DIAGNOSIS — I359 Nonrheumatic aortic valve disorder, unspecified: Secondary | ICD-10-CM | POA: Insufficient documentation

## 2012-06-19 DIAGNOSIS — O99891 Other specified diseases and conditions complicating pregnancy: Secondary | ICD-10-CM | POA: Insufficient documentation

## 2012-06-19 NOTE — Progress Notes (Signed)
Echocardiogram performed.  

## 2012-06-25 ENCOUNTER — Ambulatory Visit (INDEPENDENT_AMBULATORY_CARE_PROVIDER_SITE_OTHER): Payer: Self-pay | Admitting: Obstetrics & Gynecology

## 2012-06-25 VITALS — BP 106/64 | Temp 97.0°F | Wt 164.2 lb

## 2012-06-25 DIAGNOSIS — I519 Heart disease, unspecified: Secondary | ICD-10-CM

## 2012-06-25 DIAGNOSIS — I679 Cerebrovascular disease, unspecified: Secondary | ICD-10-CM

## 2012-06-25 LAB — POCT URINALYSIS DIP (DEVICE)
Bilirubin Urine: NEGATIVE
Glucose, UA: NEGATIVE mg/dL
Nitrite: NEGATIVE
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
## 2012-06-25 NOTE — Patient Instructions (Addendum)
Eleccin del mtodo anticonceptivo  (Contraception Choices) La anticoncepcin (control de la natalidad) es el uso de cualquier mtodo o dispositivo para Location manager. A continuacin se indican algunos de esos mtodos.  MTODOS HORMONALES   Implante anticonceptivo. Es un tubo plstico delgado que contiene la hormona progesterona. No contiene estrgenos. El mdico inserta el tubo en la parte interna del brazo. El tubo puede Geneticist, molecular durante 3 aos. Despus de los 3 aos debe retirarse. El implante impide que los ovarios liberen vulos (ovulacin), espesa el moco cervical, lo que evita que los espermatozoides ingresen al tero y hace ms delgada la membrana que cubre el interior del tero.  Inyecciones de progesterona sola. Estas inyecciones se administran cada 3 meses para evitar el embarazo. La progesterona sinttica impide que los ovarios liberen vulos. Tambin hace que el moco cervical se espese y modifica el recubrimiento interno del tero. Esto hace ms difcil que los espermatozoides sobrevivan en el tero.  Pldoras anticonceptivas. Las pldoras anticonceptivas contienen estrgenos y Education officer, museum. Actan impidiendo que el vulo se forme en el ovario(ovulacin). Las pldoras anticonceptivas son recetadas por el mdico.Tambin se utilizan para tratar los perodos menstruales abundantes.  Minipldora. Este tipo de pldora anticonceptiva contiene slo hormona progesterona. Deben tomarse todos los [address] del mes y debe recetarlas el mdico.  Parches anticonceptivos. El parche contiene hormonas similares a las que contienen las pldoras anticonceptivas. Deben cambiarse una vez por semana y se utilizan bajo prescripcin mdica.  Anillo vaginal. Anillo vaginal contiene hormonas similares a las que contienen las pldoras anticonceptivas. Se deja colocado durante tres semanas, se lo retira durante 1 semana y luego se coloca uno nuevo. La paciente debe sentirse cmoda para insertar y  retirar el anillo de la vagina.Es necesaria la receta del mdico.  Anticonceptivos de Associate Professor. Los anticonceptivos de emergencia son mtodos para evitar un embarazo despus de una relacin sexual sin proteccin. Esta pldora puede tomarse inmediatamente despus de Child psychotherapist sexuales o hasta 5 [place] de haber tenido sexo sin proteccin. Es ms efectiva si se toma poco tiempo despus. Los anticonceptivos de emergencia estn disponibles sin prescripcin mdica. Consltelo con su farmacutico. No use los anticonceptivos de emergencia como nico mtodo anticonceptivo. MTODOS DE BARRERA   Condn masculino. Es una vaina delgada (ltex o goma) que se [place] en el pene durante el acto sexual. Deri Fuelling con espermicida para aumentar la efectividad.  Condn femenino. Es una vaina blanda y floja que se adapta suavemente a la vagina antes de las relaciones sexuales.  Diafragma. Es una barrera de ltex redonda y Casimer Bilis que debe ser ajustada por un profesional. Se inserta en la vagina, junto con un gel espermicida. Debe insertarse antes de Management consultant. Debe dejar el diafragma colocado en la vagina durante 6 a 8 horas despus de la relacin sexual.  Capuchn cervical. Es una taza de ltex o plstico, redonda y [place] que cubre el cuello del tero y debe ser ajustada por un mdico. Puede dejarlo colocado en la vagina hasta 48 horas despus de las Clinical research associate.  Esponja. Es una pieza blanda y circular de espuma de poliuretano. Contiene un espermicida. Se inserta en la vagina despus de mojarla y antes de las [employer].  Espermicidas. Los espermicidas son qumicos que matan o bloquean el esperma y no lo dejan ingresar al cuello del tero y al tero. Vienen en forma de cremas, geles, supositorios, espuma o comprimidos. No es necesario tener Emergency planning/management officer. Se insertan en la vagina con un aplicador  antes de [employer]. El proceso debe repetirse cada vez que tiene  relaciones sexuales. ANTICONCEPTIVOS INTRAUTERINOS   Dispositivo intrauterino (DIU). Es un dispositivo en forma de T que se coloca en el tero durante el perodo menstrual, para Location manager. Hay dos tipos:  DIU de cobre. Este tipo de DIU est recubierto con un alambre de cobre y se inserta dentro del tero. El cobre hace que el tero y las trompas de Falopio produzcan un liquido que [employer] espermatozoides. Puede permanecer colocado durante 10 aos.  DIU hormonal. Este tipo de DIU contiene la hormona progestina (progesterona sinttica). La hormona espesa el moco cervical y evita que los espermatozoides ingresen al tero y tambin afina la membrana que cubre el tero para evitar la implantacin del vulo fertilizado. La hormona debilita o destruye los espermatozoides que ingresan al tero. Puede permanecer colocado durante 5 aos. MTODOS ANTICONCEPTIVOS PERMANENTES   Ligadura de trompas en la mujer. La ligadura de trompas en la mujer se realiza sellando, atando u obstruyendo quirrgicamente las trompas de Falopio lo que impide que el vulo descienda hacia el tero.  Esterilizacin masculina. Se realiza atando los conductos por los que pasan los espermatozoides (vasectoma).Esto impide que el esperma ingrese a la vagina durante el acto sexual. Luego del procedimiento, el hombre puede eyacular lquido (semen). MTODOS DE PLANIFICACIN NATURAL   Planificacin familiar natural.  Consiste en no tener relaciones sexuales o usar un mtodo de barrera (condn, [place], capuchn cervical) en los [employer] la mujer podra quedar [place].  Mtodo calendario.  Consiste en el seguimiento de la duracin de cada ciclo menstrual y la identificacin de los perodos frtiles.  Mtodo de Occupational hygienist.  Consiste en evitar las relaciones sexuales durante la ovulacin.  Mtodo sintotrmico. Paramedic las relaciones sexuales en la poca en la que se est ovulando, utilizando un termmetro y  tendiendo en cuenta los sntomas de la ovulacin.  Mtodo post-ovulacin. Consiste en planificar las relaciones sexuales para despus de haber ovulado. Independientemente del tipo o mtodo anticonceptivo que usted elija, es importante que use condones para protegerse contra las enfermedades de transmisin sexual (ETS). Hable con su mdico con respecto a qu mtodo anticonceptivo es el ms apropiado para usted.  Document Released: 03/18/2005 Document Revised: 06/10/2011 [facility] Patient Information 2013 [place], [place]. Lactancia materna  (Breastfeeding) Decidir Museum/gallery exhibitions officer es una de las mejores elecciones que puede hacer por usted y su beb. La informacin que se brinda a Psychologist, clinical dar una breve visin de los beneficios de la lactancia materna as como de las dudas ms frecuentes alrededor de ella.  LOS BENEFICIOS DE AMAMANTAR  Para el beb   La primera leche (calostro ) ayuda al mejor funcionamiento del sistema digestivo del beb.   La leche tiene anticuerpos que provienen de la madre y que ayudan a prevenir las infecciones en el beb.   El beb tiene una menor incidencia de asma, alergias y del sndrome de muerte sbita del lactante (SMSL).   Los nutrientes en la [place] materna son mejores para el beb que los preparados para lactantes y la [place] materna ayuda a un mejor desarrollo del cerebro del beb.   Los bebs amamantados tienen menos gases, clicos y estreimiento.  Para la mam   La lactancia materna favorece el desarrollo de un vnculo muy especial entre la madre y el beb.   Es ms conveniente, siempre disponible y a Landscape architect y Film/video editor.   Consume caloras en la madre y la [place]  a perder el peso ganado durante el embarazo.   Favorece la contraccin del tero a su tamao normal, de manera ms rpida y [employer] las hemorragias luego del [place].   Las [employer] Holdings que amamantan tienen menor riesgo de Geophysical data processor de mama.  FRECUENCIA DEL  AMAMANTAMIENTO   Un beb sano, nacido a trmino, puede amamantarse con tanta frecuencia como cada hora, o espaciar las comidas cada tres horas.   Observe al beb cuando manifieste signos de hambre. Amamante a su beb si muestra signos de hambre. Esta frecuencia variar de un beb a otro.   Amamntelo tan seguido como el beb lo solicite, o cuando usted sienta la necesidad de Paramedic sus [place].   Despierte al beb si han pasado 3  4 horas desde la ltima comida.   El amamantamiento frecuente la ayudar a producir ms [place] y a Education officer, community de Engineer, mining en los pezones e hinchazn de las [place].  POSICIN DEL BEBE PARA EL AMAMANTAMIENTO   Ya sea que se encuentre [place] o sentada, asegrese que el abdomen del beb enfrente el suyo.   Sostenga la mama con el pulgar por arriba y los otros 4 dedos por debajo. Asegrese que sus dedos se encuentren lejos del pezn y de la boca del beb.   Empuje suavemente los labios del beb con el pezn o con el dedo.   Cuando la boca del beb se abra lo suficiente, introduzca el pezn y la areola tanto como le sea posible dentro de la boca.   Coloque al beb cerca suyo de modo que su nariz y mejillas toquen las mamas al [employer].  ALIMENTACIN Y SUCCIN   La duracin de cada comida vara de un beb a otro y de Neomia Dear comida a [place].   El beb debe succionar entre 2 y 3 minutos para que Development worker, international aid. Esto se denomina "bajada". Por este motivo, permita que el nio se alimente en cada mama todo lo que desee. Terminar de mamar cuando haya recibido la cantidad [place] de nutrientes.   Para detener la succin coloque su dedo en la comisura de la boca del nio y Midwife entre sus encas antes de quitarle la mama de la boca. Esto la ayudar a English as a second language teacher.  COMO SABER SI EL BEB OBTIENE LA SUFICIENTE LECHE MATERNA  Preguntarse si el beb obtiene la cantidad suficiente de [place] es una preocupacin frecuente [employer]. Puede  asegurarse que el beb tiene la leche suficiente si:   El beb succiona activamente y usted escucha que traga .   El beb parece estar relajado y satisfecho despus de Psychologist, clinical.   El nio se alimenta al menos 8 a 12 veces en 24 horas. Alimntelo hasta que se desprenda por sus propios medios o se quede dormido en la primera mama (al menos durante 10 a 20 minutos), luego ofrzcale el otro lado.   El beb moja 5 a 6 paales desechables (6 a 8 paales de tela) en 24 horas cuando tiene 5  6 das de vida.   Tiene al Lowe's Companies 3 a 4 deposiciones todos los [employer] and [employer] primeros meses. La materia fecal debe ser blanda y [place].   El beb debe aumentar 4 a 6 libras (120 a 170 gr.) por semana despus de los 4 [address] de vida.   Siente que las mamas se ablandan despus de amamantar  REDUCIR LA CONGESTIN DE LAS MAMAS   Durante la primera semana despus del [place], usted puede experimentar  hinchazn en las mamas. Cuando las mamas estn congestionadas, se sienten calientes, llenas y molestas al tacto. Puede reducir la congestin si:   Lo amamanta frecuentemente, cada 2-3 horas. Las mams que [employer] pronto y con frecuencia tienen menos problemas de [place].   Coloque compresas de hielo en sus mamas durante 10-20 minutos entre cada amamantamiento. Esto ayuda a Building services engineer. Envuelva las bolsas de hielo en una toalla liviana para proteger su piel. Las bolsas de vegetales congelados funcionan bien para este propsito.   Tome una ducha tibia o aplique compresas hmedas calientes en las mamas durante 5 a 10 minutos antes de cada vez que [place]. Esto aumenta la circulacin y [place] a que la [place].   Masajee suavemente la mama antes y Psychologist, sport and exercise. Con las puntas de los dedos, masajee desde la pared torcica hacia abajo hasta llegar al pezn, con movimientos circulares.   Asegrese que el nio vaca al menos una mama antes de cambiar de lado.   Use un sacaleche para  vaciar la mama si el beb se duerme o no se alimenta bien. Tambin podr Phel[employer] con esa bomba si tiene que volver al trabajo o siente que las mamas estn congestionadas.   Evite los biberones, chupetes o complementar la alimentacin con agua o jugos en lugar de la [place]. La leche materna es todo el alimento que el beb necesita. No es necesario que el nio ingiera agua o preparados de bibern. Louann Liv, es lo mejor para ayudar a que las mamas produzcan ms [place]. no darle suplementos al [employer] las primeras semanas.   Verifique que el beb se encuentra en la posicin correcta mientras lo alimenta.   Use un sostn que soporte bien sus mamas y State [employer] Corporation que tienen aro.   Consuma una dieta balanceada y beba lquidos en cantidad.   Descanse con frecuencia, reljese y tome sus vitaminas prenatales para evitar la fatiga, el estrs y la anemia.  Si sigue estas indicaciones, la congestin debe mejorar en 24 a 48 horas. Si an tiene dificultades, consulte a Barista.  CUDESE USTED MISMA  Cuide sus mamas.   Bese o dchese diariamente.   Evite usar [employer].   Comience a amamantar del lado izquierdo en una comida y del lado derecho en la siguiente.   Notar que aumenta el flujo de [place] a los 2 a 5 [address] despus del [address]. Puede sentir algunas molestias por la congestin, lo que hace que sus mamas estn duras y sensibles. La congestin disminuye en 24 a 48 horas. Mientras tanto, aplique toallas hmedas calientes durante 5 a 10 minutos antes de amamantar. Un masaje suave y la extraccin de un poco de leche antes de Museum/gallery exhibitions officer ablandarn las mamas y har ms fcil que el beb se agarre.   Use un buen sostn y seque al aire los pezones durante 3 a 4 minutos luego de Wellsite geologist.   Solo utilice apsitos de algodn.   Utilice lanolina [employer] pezones luego de [place]. No necesita lavarlos luego de alimentar al [employer]. Otra opcin  es exprimir algunas gotas de [place] y [employer] Holdings pezones.  Cumpla con estos cuidados   Consuma alimentos bien balanceados y refrigerios nutritivos.   Dixie Dials, jugos de fruta y agua para Warehouse manager sed (alrededor de 8 vasos por Futures trader).   Descanse lo suficiente.  Evite los alimentos que usted note que pueden afectar al beb.  SOLICITE ATENCIN  MDICA SI:   Tiene dificultad con la lactancia materna y [place].   Tiene una zona de color rojo, dura y dolorosa en la mama que se acompaa de [place].   El beb est muy somnoliento como para alimentarse bien o tiene problemas para dormir.   Su beb moja menos de 6 paales al [address], a los 5 [address]  [address] de vida.   La piel del beb o la parte blanca de sus ojos est ms amarilla de lo que estaba en el hospital.   Se siente deprimida.  Document Released: 03/18/2005 Document Revised: 09/17/2011 [facility] Patient Information 2013 [place], [place]. Levonorgestrel intrauterine device (IUD) Qu es este medicamento? El LEVONORGESTREL (DIU) es un dispositivo anticonceptivo (control de natalidad). Se utiliza para Location manager durante un perodo de [place]. Este medicamento puede ser utilizado para otros usos; si tiene alguna pregunta consulte con su proveedor de atencin mdica o con su farmacutico. Qu le debo informar a mi profesional de la salud antes de tomar este medicamento? Necesita saber si usted presenta alguno de los siguientes problemas o situaciones: -exmen de Papanicolaou anormal -cncer de mama, cuello del tero o tero -diabetes -endometritis -si tiene una infeccin plvica o genital actual o en el pasado -tiene ms de una pareja sexual o si su pareja tiene ms de una pareja -enfermedad cardiaca -antecedente de embarazo tubrico o ectpico -problemas del sistema inmunolgico -DIU colocado -enfermedad heptica o tumor del hgado -problemas con la coagulacin o si toma diluyentes sanguneos -[place]  medicamentos intravenoso -forma inusual del tero -sangrado vaginal que no tiene explicacin -una reaccin alrgica o inusual al levonorgestrel, a otras hormonas, a la silicona o polietilenos, a otros medicamentos, alimentos, colorantes o conservantes -si est embarazada o buscando quedar embarazada -si est amamantando a un beb Cmo debo utilizar este medicamento? Un profesional de Naval architect este dispositivo en el tero. Hable con su pediatra para informarse acerca del uso de este medicamento en nios. Puede requerir atencin especial. Sobredosis: Pngase en contacto inmediatamente con un centro toxicolgico o una sala de urgencia si usted cree que haya tomado demasiado medicamento. ATENCIN: Reynolds American es solo para usted. No comparta este medicamento con nadie. Qu sucede si me olvido de una dosis? No se aplica en este caso. Qu puede interactuar con este medicamento? No tome esta medicina con ninguno de los siguientes medicamentos: -amprenavir -bosentano -fosamprenavir Esta medicina tambin puede interactuar con los siguientes medicamentos: -aprepitant -barbitricos para producir el sueo o para el tratamiento de convulsiones -bexaroteno -griseofulvina -medicamentos para tratar los convulsiones, tales como [place], [place], [place], [place], [place], topiramato -modafinilo -pioglitazona -rifabutina -rifampicina -rifapentina -algunos medicamentos para tratar el virus VIH, tales como atazanavir, indinavir, lopinavir, nelfinavir, tipranavir, ritonavir -hierba de [place] -warfarina Puede ser que esta lista no menciona todas las posibles interacciones. Informe a su profesional de [employer] de [employer] Inc productos a base de hierbas, medicamentos de [place] o suplementos nutritivos que est tomando. Si usted fuma, consume bebidas alcohlicas o si utiliza drogas ilegales, indqueselo tambin a su profesional de [employer]. Algunas sustancias pueden  interactuar con su medicamento. A qu debo estar atento al usar PPL Corporation? Visite a su mdico o a su profesional de la salud para chequear su evolucin peridicamente. Visite a su mdico si usted o su pareja tiene relaciones sexuales con [employer] Corporation, se vuelve VIH positivo o contrae una enfermedad de transmisin sexual. Cleda Clarks medicamento no la protege de la infeccin por VIH (SIDA) ni de ninguna otra enfermedad de  transmisin sexual. Puede controlar la ubicacin del DIU usted misma palpando con sus dedos limpios los hilos en la parte anterior de la vagina. No tire de los hilos. Es un buen hbito controlar la ubicacin del dispositivo despus de cada perodo menstrual. Si no slo siente los hilos sino que adems siente otra parte ms del DIU o si no puede sentir los hilos, consulte a su mdico inmediatamente. El DIU puede salirse por s solo. Puede quedar embarazada si el dispositivo se sale de Nature conservation officer. Utilice un mtodo anticonceptivo adicional, como preservativos, y consulte a su proveedor de atencin mdica s observa que el DIU se sali de Nature conservation officer. La utilizacin de tampones no cambia la posicin del DIU y no hay inconvenientes en usarlos durante su perodo. Qu efectos secundarios puedo tener al [employer] este medicamento? Efectos secundarios que debe informar a su mdico o a Producer, television/film/video de la salud tan pronto como sea posible: -Therapist, art como erupcin cutnea, picazn o urticarias, hinchazn de la cara, labios o lengua -fiebre, sntomas gripales -llagas genitales -alta presin sangunea -ausencia de un perodo menstrual durante 6 semanas mientras lo utiliza -Engineer, mining, Public librarian en las piernas -dolor o sensibilidad del plvico -dolor de cabeza repentino o severo -signos de Psychiatrist -calambres estomacales -falta de aliento repentina -problemas de coordinacin, del habla, al caminar -sangrado, flujo vaginal inusual -color amarillento de los ojos o la  piel Efectos secundarios que, por lo general, no requieren atencin mdica (debe informarlos a su mdico o a su profesional de la salud si persisten o si son molestos): -acn -dolor de pecho -cambios en el deseo sexual o capacidad -cambios de peso -calambres, Research scientist (life sciences) o sensacin de The Peps[employer] se introduce el dispositivo -dolor de cabeza -sangrado menstruales irregulares en los primeros 3 a 6 meses de usar -nuseas Puede ser que esta lista no menciona todos los posibles efectos secundarios. Comunquese a su mdico por asesoramiento mdico Hewlett-[employer]ard. Usted puede informar los efectos secundarios a la FDA por telfono al 1-800-FDA-1088. Dnde debo guardar mi medicina? No se aplica en este caso. ATENCIN: Este folleto es un resumen. Puede ser que no cubra toda la posible informacin. Si usted tiene preguntas acerca de esta medicina, consulte con su mdico, su farmacutico o su profesional de Radiographer, therapeutic.  2012, Elsevier/Gold Standard. (05/30/2008 3:17:12 PM)

## 2012-06-25 NOTE — Progress Notes (Signed)
Echo shows moderate MS.  Needs follow up with Dr. Tenny Craw.  Co-managing with MFM with delivery planned at [facility].  Pt failed one hour GCT and passed 3 hour GTT in Feb.  Will rpt 3 hour GTT now that 28 weeks.  Pt will schedule.  Needs cardiology follow up now that echo is completed.  Pt denies CP, SOB, or other complaints.

## 2012-06-25 NOTE — Progress Notes (Signed)
Called and made MFM appointment for 07/01/12  and cardiology appt with Dr. Tenny Craw for 06/29/12.

## 2012-06-26 ENCOUNTER — Telehealth: Payer: Self-pay | Admitting: Internal Medicine

## 2012-06-26 NOTE — Telephone Encounter (Signed)
Spoke to Dr Vincenza Hews (MFM)  Echo shows LAE  Normal LV function  Moderate MS with increased filling pressures I would recomm [facility] as patient is at increased risks of atrial arrhythmias and CHF   Understand that that is what is being planned Will f/u with patient before (as needed) or after.

## 2012-06-29 ENCOUNTER — Other Ambulatory Visit: Payer: Self-pay | Admitting: Family Medicine

## 2012-06-29 ENCOUNTER — Encounter: Payer: Self-pay | Admitting: Internal Medicine

## 2012-06-29 ENCOUNTER — Encounter: Payer: Self-pay | Admitting: Family Medicine

## 2012-06-29 ENCOUNTER — Ambulatory Visit (INDEPENDENT_AMBULATORY_CARE_PROVIDER_SITE_OTHER): Payer: Self-pay | Admitting: Internal Medicine

## 2012-06-29 VITALS — BP 110/62 | HR 68 | Ht 59.0 in | Wt 165.1 lb

## 2012-06-29 DIAGNOSIS — Z3490 Encounter for supervision of normal pregnancy, unspecified, unspecified trimester: Secondary | ICD-10-CM

## 2012-06-29 DIAGNOSIS — O358XX1 Maternal care for other (suspected) fetal abnormality and damage, fetus 1: Secondary | ICD-10-CM

## 2012-06-29 DIAGNOSIS — I050 Rheumatic mitral stenosis: Secondary | ICD-10-CM

## 2012-06-29 DIAGNOSIS — Z331 Pregnant state, incidental: Secondary | ICD-10-CM

## 2012-06-29 NOTE — Progress Notes (Signed)
HPI Regina Phelps is a 32 yo with a history of MV stenosis (s/p valvuloplasty at [facility]), SVT (2012, self limited)  She was in clinic in 2012.  I saw her again earlier this month.  She is now [redacted] wks pregnant. Since seen she has had an echo  This showed normal LV systolic function.  There was moderate MS with a mean gradient of 10 to 14. Since I saw her she says her breathing is OK  She denies CP  No PND  No edema No Known Allergies  Current Outpatient Prescriptions  Medication Sig Dispense Refill  . prenatal vitamin w/FE, FA (PRENATAL 1 + 1) 27-1 MG TABS Take 1 tablet by mouth daily.       No current facility-administered medications for this visit.    Past Medical History  Diagnosis Date  . Mitral stenosis   . Pulmonary hypertension     Improved following mitral valvuloplasty  . Palpitations     Past Surgical History  Procedure Laterality Date  . Mitral valvuloplasty    . Cardiac valve surgery      Family History  Problem Relation Age of Onset  . Diabetes Mother   . Diabetes Father     History   Social History  . Marital Status: Married    Spouse Name: N/A    Number of Children: N/A  . Years of Education: N/A   Occupational History  . Not on file.   Social History Main Topics  . Smoking status: Never Smoker   . Smokeless tobacco: Never Used  . Alcohol Use: No  . Drug Use: No  . Sexually Active: Not on file   Other Topics Concern  . Not on file   Social History Narrative  . No narrative on file    Review of Systems:  All systems reviewed.  They are negative to the above problem except as previously stated.  Vital Signs: BP 110/62  Pulse 68  Ht 4\' 11"  (1.499 m)  Wt 165 lb 1.9 oz (74.898 kg)  BMI 33.33 kg/m2  LMP 12/18/2011  Physical Exam Patient is in NAD HEENT:  Normocephalic, atraumatic. EOMI, PERRLA.  Neck: JVP is normal.  No bruits.  Lungs: clear to auscultation. No rales no wheezes.  Heart: Regular rate and rhythm. Normal S1, S2. No S3.   Gr I/VI  diastolic murmur at apex. PMI not displaced.  Abdomen:  Distended Normal bowel sounds.   No hepatomegaly.  Extremities:   Good distal pulses throughout. No lower extremity edema.  Musculoskeletal :moving all extremities.  Neuro:   alert and oriented x3.  CN II-XII grossly intact.   Assessment and Plan:  1.  Mitral stenosis.  Moderate.  I have discussed echo findings with Dr Vincenza Hews.  Ms Gunawan is doing well but she is a increased risk for CHF as well as atrial arrhythmias later in pregnancy and in peripartum period. I think she would do well to be followed with delivery in [facility]. I will plan on seeing her after delivery later this summer. I will be available as needed should she develop edema or arrhythmias earlier.

## 2012-06-29 NOTE — Patient Instructions (Addendum)
Your physician wants you to follow-up in: August 2014 with Dr. Ross.  You will receive a reminder letter in the mail two months in advance. If you don't receive a letter, please call our office to schedule the follow-up appointment.  

## 2012-07-01 ENCOUNTER — Ambulatory Visit (HOSPITAL_COMMUNITY)
Admission: RE | Admit: 2012-07-01 | Discharge: 2012-07-01 | Disposition: A | Discharge status: Home / Self Care | Payer: Self-pay | Source: Ambulatory Visit | Attending: Obstetrics & Gynecology | Admitting: Obstetrics & Gynecology

## 2012-07-01 ENCOUNTER — Ambulatory Visit (HOSPITAL_COMMUNITY): Admission: RE | Admit: 2012-07-01 | Payer: Self-pay | Source: Ambulatory Visit

## 2012-07-01 VITALS — BP 96/49 | HR 65 | Wt 165.2 lb

## 2012-07-01 DIAGNOSIS — O352XX0 Maternal care for (suspected) hereditary disease in fetus, not applicable or unspecified: Secondary | ICD-10-CM | POA: Insufficient documentation

## 2012-07-01 DIAGNOSIS — O358XX1 Maternal care for other (suspected) fetal abnormality and damage, fetus 1: Secondary | ICD-10-CM

## 2012-07-02 ENCOUNTER — Encounter (HOSPITAL_COMMUNITY): Payer: Self-pay

## 2012-07-09 ENCOUNTER — Other Ambulatory Visit: Payer: Self-pay

## 2012-07-09 ENCOUNTER — Encounter: Payer: Self-pay | Admitting: Obstetrics & Gynecology

## 2012-07-16 ENCOUNTER — Other Ambulatory Visit: Payer: Self-pay | Admitting: Obstetrics & Gynecology

## 2012-07-16 ENCOUNTER — Ambulatory Visit (INDEPENDENT_AMBULATORY_CARE_PROVIDER_SITE_OTHER): Payer: Self-pay | Admitting: Obstetrics & Gynecology

## 2012-07-16 VITALS — BP 92/57 | Temp 97.6°F | Wt 165.7 lb

## 2012-07-16 DIAGNOSIS — D696 Thrombocytopenia, unspecified: Secondary | ICD-10-CM

## 2012-07-16 DIAGNOSIS — O98212 Gonorrhea complicating pregnancy, second trimester: Secondary | ICD-10-CM

## 2012-07-16 DIAGNOSIS — R7309 Other abnormal glucose: Secondary | ICD-10-CM

## 2012-07-16 DIAGNOSIS — O99119 Other diseases of the blood and blood-forming organs and certain disorders involving the immune mechanism complicating pregnancy, unspecified trimester: Secondary | ICD-10-CM

## 2012-07-16 DIAGNOSIS — O98219 Gonorrhea complicating pregnancy, unspecified trimester: Secondary | ICD-10-CM

## 2012-07-16 LAB — CBC
HCT: 35.7 % — ABNORMAL LOW (ref 36.0–46.0)
Hemoglobin: 12 g/dL (ref 12.0–15.0)
MCV: 85.2 fL (ref 78.0–100.0)
WBC: 6.9 10*3/uL (ref 4.0–10.5)

## 2012-07-16 LAB — POCT URINALYSIS DIP (DEVICE)
Bilirubin Urine: NEGATIVE
Ketones, ur: NEGATIVE mg/dL
Protein, ur: NEGATIVE mg/dL
Specific Gravity, Urine: 1.015 (ref 1.005–1.030)
pH: 7 (ref 5.0–8.0)

## 2012-07-16 LAB — GLUCOSE TOLERANCE, 3 HOURS
Glucose Tolerance, 2 hour: 132 mg/dL (ref 70–164)
Glucose, GTT - 3 Hour: 118 mg/dL (ref 70–144)

## 2012-07-16 NOTE — Progress Notes (Signed)
Pt denies SOB or CP.  PT saw Dr. Tenny Craw (cards) at the end of March.  Plan is to delvier at [facility].  Followed by MFM here with scans.  Last growth WNL.  Next [place] is April 30th.  MFM is co managing.

## 2012-07-16 NOTE — Patient Instructions (Addendum)
Levonorgestrel intrauterine device (IUD) Qu es este medicamento? El LEVONORGESTREL (DIU) es un dispositivo anticonceptivo (control de natalidad). Se utiliza para evitar el embarazo durante un perodo de hasta 5 aos. Este medicamento puede ser utilizado para otros usos; si tiene alguna pregunta consulte con su proveedor de atencin mdica o con su farmacutico. Qu le debo informar a mi profesional de la salud antes de tomar este medicamento? Necesita saber si usted presenta alguno de los siguientes problemas o situaciones: -exmen de Papanicolaou anormal -cncer de mama, cuello del tero o tero -diabetes -endometritis -si tiene una infeccin plvica o genital actual o en el pasado -tiene ms de una pareja sexual o si su pareja tiene ms de una pareja -enfermedad cardiaca -antecedente de embarazo tubrico o ectpico -problemas del sistema inmunolgico -DIU colocado -enfermedad heptica o tumor del hgado -problemas con la coagulacin o si toma diluyentes sanguneos -usa medicamentos intravenoso -forma inusual del tero -sangrado vaginal que no tiene explicacin -una reaccin alrgica o inusual al levonorgestrel, a otras hormonas, a la silicona o polietilenos, a otros medicamentos, alimentos, colorantes o conservantes -si est embarazada o buscando quedar embarazada -si est amamantando a un beb Cmo debo utilizar este medicamento? Un profesional de la salud coloca este dispositivo en el tero. Hable con su pediatra para informarse acerca del uso de este medicamento en nios. Puede requerir atencin especial. Sobredosis: Pngase en contacto inmediatamente con un centro toxicolgico o una sala de urgencia si usted cree que haya tomado demasiado medicamento. ATENCIN: Este medicamento es solo para usted. No comparta este medicamento con nadie. Qu sucede si me olvido de una dosis? No se aplica en este caso. Qu puede interactuar con este medicamento? No tome esta medicina con  ninguno de los siguientes medicamentos: -amprenavir -bosentano -fosamprenavir Esta medicina tambin puede interactuar con los siguientes medicamentos: -aprepitant -barbitricos para producir el sueo o para el tratamiento de convulsiones -bexaroteno -griseofulvina -medicamentos para tratar los convulsiones, tales como carbamazepina, etotona, felbamato, oxcarbazepina, fenitona, topiramato -modafinilo -pioglitazona -rifabutina -rifampicina -rifapentina -algunos medicamentos para tratar el virus VIH, tales como atazanavir, indinavir, lopinavir, nelfinavir, tipranavir, ritonavir -hierba de [place] -warfarina Puede ser que esta lista no menciona todas las posibles interacciones. Informe a su profesional de la salud de todos los productos a base de hierbas, medicamentos de venta libre o suplementos nutritivos que est tomando. Si usted fuma, consume bebidas alcohlicas o si utiliza drogas ilegales, indqueselo tambin a su profesional de la salud. Algunas sustancias pueden interactuar con su medicamento. A qu debo estar atento al usar este medicamento? Visite a su mdico o a su profesional de la salud para chequear su evolucin peridicamente. Visite a su mdico si usted o su pareja tiene relaciones sexuales con otras personas, se vuelve VIH positivo o contrae una enfermedad de transmisin sexual. Este medicamento no la protege de la infeccin por VIH (SIDA) ni de ninguna otra enfermedad de transmisin sexual. Puede controlar la ubicacin del DIU usted misma palpando con sus dedos limpios los hilos en la parte anterior de la vagina. No tire de los hilos. Es un buen hbito controlar la ubicacin del dispositivo despus de cada perodo menstrual. Si no slo siente los hilos sino que adems siente otra parte ms del DIU o si no puede sentir los hilos, consulte a su mdico inmediatamente. El DIU puede salirse por s solo. Puede quedar embarazada si el dispositivo se sale de su lugar. Utilice un  mtodo anticonceptivo adicional, como preservativos, y consulte a su proveedor de atencin mdica s observa que   el DIU se sali de Nature conservation officer. La utilizacin de tampones no cambia la posicin del DIU y no hay inconvenientes en usarlos durante su perodo. Qu efectos secundarios puedo tener al [employer] este medicamento? Efectos secundarios que debe informar a su mdico o a Producer, television/film/video de la salud tan pronto como sea posible: -Therapist, art como erupcin cutnea, picazn o urticarias, hinchazn de la cara, labios o lengua -fiebre, sntomas gripales -llagas genitales -alta presin sangunea -ausencia de un perodo menstrual durante 6 semanas mientras lo utiliza -Engineer, mining, Public librarian en las piernas -dolor o sensibilidad del plvico -dolor de cabeza repentino o severo -signos de Psychiatrist -calambres estomacales -falta de aliento repentina -problemas de coordinacin, del habla, al caminar -sangrado, flujo vaginal inusual -color amarillento de los ojos o la piel Efectos secundarios que, por lo general, no requieren atencin mdica (debe informarlos a su mdico o a su profesional de la salud si persisten o si son molestos): -acn -dolor de pecho -cambios en el deseo sexual o capacidad -cambios de peso -calambres, Research scientist (life sciences) o sensacin de [employer] se introduce el dispositivo -dolor de cabeza -sangrado menstruales irregulares en los primeros 3 a 6 meses de usar -nuseas Puede ser que esta lista no menciona todos los posibles efectos secundarios. Comunquese a su mdico por asesoramiento mdico [employer]. Usted puede informar los efectos secundarios a la FDA por telfono al 1-800-FDA-1088. Dnde debo guardar mi medicina? No se aplica en este caso. ATENCIN: Este folleto es un resumen. Puede ser que no cubra toda la posible informacin. Si usted tiene preguntas acerca de esta medicina, consulte con su mdico, su farmacutico o su profesional de Radiographer, therapeutic.   2012, Elsevier/Gold Standard. (05/30/2008 3:17:12 PM)Eleccin del mtodo anticonceptivo  (Contraception Choices) La anticoncepcin (control de la natalidad) es el uso de cualquier mtodo o dispositivo para Location manager. A continuacin se indican algunos de esos mtodos.  MTODOS HORMONALES   Implante anticonceptivo. Es un tubo plstico delgado que contiene la hormona progesterona. No contiene estrgenos. El mdico inserta el tubo en la parte interna del brazo. El tubo puede Geneticist, molecular durante 3 aos. Despus de los 3 aos debe retirarse. El implante impide que los ovarios liberen vulos (ovulacin), espesa el moco cervical, lo que evita que los espermatozoides ingresen al tero y hace ms delgada la membrana que cubre el interior del tero.  Inyecciones de progesterona sola. Estas inyecciones se administran cada 3 meses para evitar el embarazo. La progesterona sinttica impide que los ovarios liberen vulos. Tambin hace que el moco cervical se espese y modifica el recubrimiento interno del tero. Esto hace ms difcil que los espermatozoides sobrevivan en el tero.  Pldoras anticonceptivas. Las pldoras anticonceptivas contienen estrgenos y Education officer, museum. Actan impidiendo que el vulo se forme en el ovario(ovulacin). Las pldoras anticonceptivas son recetadas por el mdico.Tambin se utilizan para tratar los perodos menstruales abundantes.  Minipldora. Este tipo de pldora anticonceptiva contiene slo hormona progesterona. Deben tomarse todos los [address] del mes y debe recetarlas el mdico.  Parches anticonceptivos. El parche contiene hormonas similares a las que contienen las pldoras anticonceptivas. Deben cambiarse una vez por semana y se utilizan bajo prescripcin mdica.  Anillo vaginal. Anillo vaginal contiene hormonas similares a las que contienen las pldoras anticonceptivas. Se deja colocado durante tres semanas, se lo retira durante 1 semana y luego se coloca uno nuevo.  La paciente debe sentirse cmoda para insertar y retirar el anillo de la vagina.Es necesaria la receta del mdico.  Anticonceptivos  de emergencia. Los anticonceptivos de emergencia son mtodos para evitar un embarazo despus de una relacin sexual sin proteccin. Esta pldora puede tomarse inmediatamente despus de Child psychotherapist sexuales o hasta 5 [place] de haber tenido sexo sin proteccin. Es ms efectiva si se toma poco tiempo despus. Los anticonceptivos de emergencia estn disponibles sin prescripcin mdica. Consltelo con su farmacutico. No use los anticonceptivos de emergencia como nico mtodo anticonceptivo. MTODOS DE BARRERA   Condn masculino. Es una vaina delgada (ltex o goma) que se [place] en el pene durante el acto sexual. Deri Fuelling con espermicida para aumentar la efectividad.  Condn femenino. Es una vaina blanda y floja que se adapta suavemente a la vagina antes de las relaciones sexuales.  Diafragma. Es una barrera de ltex redonda y Casimer Bilis que debe ser ajustada por un profesional. Se inserta en la vagina, junto con un gel espermicida. Debe insertarse antes de Management consultant. Debe dejar el diafragma colocado en la vagina durante 6 a 8 horas despus de la relacin sexual.  Capuchn cervical. Es una taza de ltex o plstico, redonda y [place] que cubre el cuello del tero y debe ser ajustada por un mdico. Puede dejarlo colocado en la vagina hasta 48 horas despus de las Clinical research associate.  Esponja. Es una pieza blanda y circular de espuma de poliuretano. Contiene un espermicida. Se inserta en la vagina despus de mojarla y antes de las [employer].  Espermicidas. Los espermicidas son qumicos que matan o bloquean el esperma y no lo dejan ingresar al cuello del tero y al tero. Vienen en forma de cremas, geles, supositorios, espuma o comprimidos. No es necesario tener Emergency planning/management officer. Se insertan en la vagina con un aplicador antes de Management consultant. El  proceso debe repetirse cada vez que tiene relaciones sexuales. ANTICONCEPTIVOS INTRAUTERINOS   Dispositivo intrauterino (DIU). Es un dispositivo en forma de T que se coloca en el tero durante el perodo menstrual, para Location manager. Hay dos tipos:  DIU de cobre. Este tipo de DIU est recubierto con un alambre de cobre y se inserta dentro del tero. El cobre hace que el tero y las trompas de Falopio produzcan un liquido que [employer] espermatozoides. Puede permanecer colocado durante 10 aos.  DIU hormonal. Este tipo de DIU contiene la hormona progestina (progesterona sinttica). La hormona espesa el moco cervical y evita que los espermatozoides ingresen al tero y tambin afina la membrana que cubre el tero para evitar la implantacin del vulo fertilizado. La hormona debilita o destruye los espermatozoides que ingresan al tero. Puede permanecer colocado durante 5 aos. MTODOS ANTICONCEPTIVOS PERMANENTES   Ligadura de trompas en la mujer. La ligadura de trompas en la mujer se realiza sellando, atando u obstruyendo quirrgicamente las trompas de Falopio lo que impide que el vulo descienda hacia el tero.  Esterilizacin masculina. Se realiza atando los conductos por los que pasan los espermatozoides (vasectoma).Esto impide que el esperma ingrese a la vagina durante el acto sexual. Luego del procedimiento, el hombre puede eyacular lquido (semen). MTODOS DE PLANIFICACIN NATURAL   Planificacin familiar natural.  Consiste en no tener relaciones sexuales o usar un mtodo de barrera (condn, Anon Raices, capuchn cervical) en los [employer] la mujer podra quedar [place].  Mtodo calendario.  Consiste en el seguimiento de la duracin de cada ciclo menstrual y la identificacin de los perodos frtiles.  Mtodo de Occupational hygienist.  Consiste en evitar las relaciones sexuales durante la ovulacin.  Mtodo sintotrmico. Advertising copywriter  sexuales en la poca en la que se  est ovulando, utilizando un termmetro y tendiendo en cuenta los sntomas de la ovulacin.  Mtodo post-ovulacin. Consiste en planificar las relaciones sexuales para despus de haber ovulado. Independientemente del tipo o mtodo anticonceptivo que usted elija, es importante que use condones para protegerse contra las enfermedades de transmisin sexual (ETS). Hable con su mdico con respecto a qu mtodo anticonceptivo es el ms apropiado para usted.  Document Released: 03/18/2005 Document Revised: 06/10/2011 [facility] Patient Information 2013 [place], [place].

## 2012-07-28 ENCOUNTER — Encounter: Payer: Self-pay | Admitting: Obstetrics & Gynecology

## 2012-07-29 ENCOUNTER — Encounter: Payer: Self-pay | Admitting: Obstetrics & Gynecology

## 2012-07-29 ENCOUNTER — Other Ambulatory Visit (HOSPITAL_COMMUNITY): Payer: Self-pay | Admitting: Maternal and Fetal Medicine

## 2012-07-29 ENCOUNTER — Ambulatory Visit (HOSPITAL_COMMUNITY)
Admission: RE | Admit: 2012-07-29 | Discharge: 2012-07-29 | Disposition: A | Discharge status: Home / Self Care | Payer: Self-pay | Source: Ambulatory Visit | Attending: Obstetrics & Gynecology | Admitting: Obstetrics & Gynecology

## 2012-07-29 VITALS — BP 97/55 | HR 76 | Wt 167.0 lb

## 2012-07-29 DIAGNOSIS — O352XX0 Maternal care for (suspected) hereditary disease in fetus, not applicable or unspecified: Secondary | ICD-10-CM | POA: Insufficient documentation

## 2012-07-29 DIAGNOSIS — O358XX1 Maternal care for other (suspected) fetal abnormality and damage, fetus 1: Secondary | ICD-10-CM

## 2012-07-30 ENCOUNTER — Other Ambulatory Visit: Payer: Self-pay | Admitting: Family Medicine

## 2012-07-30 ENCOUNTER — Ambulatory Visit (INDEPENDENT_AMBULATORY_CARE_PROVIDER_SITE_OTHER): Payer: Self-pay | Admitting: Family Medicine

## 2012-07-30 VITALS — BP 100/65 | Temp 97.2°F | Wt 166.9 lb

## 2012-07-30 DIAGNOSIS — I679 Cerebrovascular disease, unspecified: Secondary | ICD-10-CM

## 2012-07-30 DIAGNOSIS — O0990 Supervision of high risk pregnancy, unspecified, unspecified trimester: Secondary | ICD-10-CM

## 2012-07-30 DIAGNOSIS — I519 Heart disease, unspecified: Secondary | ICD-10-CM

## 2012-07-30 LAB — POCT URINALYSIS DIP (DEVICE)
Bilirubin Urine: NEGATIVE
Glucose, UA: NEGATIVE mg/dL
Ketones, ur: NEGATIVE mg/dL
Protein, ur: NEGATIVE mg/dL

## 2012-07-30 MED ORDER — TRIPLE ANTIBIOTIC 5-400-5000 EX OINT: TOPICAL_OINTMENT | Freq: Four times a day (QID) | CUTANEOUS | Status: DC

## 2012-07-30 NOTE — Patient Instructions (Signed)
Embarazo  Tercer trimestre  (Pregnancy - Third Trimester) El tercer trimestre del embarazo (los ltimos 3 meses) es el perodo en el cual tanto usted como su beb crecen con ms rapidez. El beb alcanza un largo de aproximadamente 50 cm. y pesa entre 2,700 y 4,500 kg. El beb gana ms tejido graso y est listo para la vida fuera del cuerpo de la madre. Mientras estn en el interior, los bebs tienen perodos de sueo y vigilia, succionan el pulgar y tienen hipo. Quizs sienta pequeas contracciones del tero. Este es el falso trabajo de parto. Tambin se las conoce como contracciones de Braxton-Hicks . Es como una prctica del parto. Los problemas ms habituales de esta etapa del embarazo incluyen mayor dificultad para respirar, hinchazn de las manos y los pies por retencin de lquidos y la necesidad de orinar con ms frecuencia debido a que el tero y el beb presionan sobre la vejiga.  EXAMENES PRENATALES   Durante los exmenes prenatales, deber seguir realizndose anlisis de sangre. Estas pruebas se realizan para controlar su salud y la del beb. Los anlisis de sangre se realizan para conocer los niveles de algunos compuestos de la sangre (hemoglobina). La anemia (bajo nivel de hemoglobina) es frecuente durante el embarazo. Para prevenirla, se administran hierro y vitaminas. Tambin le tomarn nuevas anlisis para descartar diabetes. Podrn repetirle algunas de las pruebas que le hicieron previamente.  En cada visita le medirn el tamao del tero. Esto permite asegurar que el beb se desarrolla adecuadamente, segn la fecha del embarazo.  Le controlarn la presin arterial en cada visita prenatal. Esto es para asegurarse de que no sufre toxemia.  Le harn un anlisis de orina en cada visita prenatal, para descartar infecciones, diabetes y la presencia de protenas.  Tambin en cada visita controlarn su peso. Esto se realiza para asegurarse que aumenta de peso al ritmo indicado y que usted y su  beb evolucionan normalmente.  En algunas ocasiones se realiza una prueba de ultrasonido para confirmar el correcto desarrollo y evolucin del beb. Esta prueba se realiza con ondas sonoras inofensivas para el beb, de modo que el profesional pueda calcular ms precisamente la fecha del parto.  Analice con su mdico los analgsicos y la anestesia que recibir durante el trabajo de parto y el parto.  Comente la posibilidad de que necesite una cesrea y qu anestesia se recibir.  Informe a su mdico si sufre violencia familiar mental o fsica. A veces, se indica la prueba especializada sin estrs, la prueba de tolerancia a las contracciones y el perfil biofsico para asegurarse de que el beb no tiene problemas. El estudio del lquido amnitico que rodea al beb se llama amniocentesis. El lquido amnitico se obtiene introduciendo una aguja en el vientre (abdomen ). En ocasiones se lleva a cabo cerca del final del embarazo, si es necesario inducir a un parto. En este caso se realiza para asegurarse que los pulmones del beb estn lo suficientemente maduros como para que pueda vivir fuera del tero. Si los pulmones no han madurado y es peligroso que el beb nazca, se administrar a la madre una inyeccin de cortisona , 1 a 2 das antes del parto. . Esto ayuda a que los pulmones del beb maduren y sea ms seguro su nacimiento.  CAMBIOS QUE OCURREN EN EL TERCER TRIMESTRE DEL EMBARAZO  Su organismo atravesar numerosos cambios durante el embarazo. Estos pueden variar de una persona a otra. Converse con el profesional que la asiste acerca los cambios que   usted note y que la preocupen.   Durante el ltimo trimestre probablemente sienta un aumento del apetito. Es normal tener "antojos" de ciertas comidas. Esto vara de una persona a otra y de un embarazo a otro.  Podrn aparecer las primeras estras en las caderas, abdomen y mamas. Estos son cambios normales del cuerpo durante el embarazo. No existen  medicamentos ni ejercicios que puedan prevenir estos cambios.  La constipacin puede tratarse con un laxante o agregando fibra a su dieta. Beber grandes cantidades de lquidos, tomar fibras en forma de vegetales, frutas y granos integrales es de gran ayuda.  Tambin es beneficioso practicar actividad fsica. Si ha sido una persona activa hasta el embarazo, podr continuar con la mayora de las actividades durante el mismo. Si ha sido menos activa, puede ser beneficioso que comience con un programa de ejercicios, como realizar caminatas. Consulte con el profesional que la asiste antes de comenzar un programa de ejercicios.  Evite el consumo de cigarrillos, el alcohol, los medicamentos no recetados y las "drogas de la calle" durante el embarazo. Estas sustancias qumicas afectan la formacin y el desarrollo del beb. Evite estas sustancias durante todo el embarazo para asegurar el nacimiento de un beb sano.  Podr sentir dolor de espalda, tener vrices en las venas y hemorroides, o si ya los sufra, pueden empeorar.  Durante el tercer trimestre se cansar con ms facilidad, lo cual es normal.  Los movimientos del beb pueden ser ms fuertes y con ms frecuencia.  Puede que note dificultades para respirar normalmente.  El ombligo puede salir hacia afuera.  A veces sale una secrecin amarilla de las mamas, que se llama calostro.  Podr aparecer una secrecin mucosa con sangre. Esto suele ocurrir entre unos pocos das y una semana antes del parto. INSTRUCCIONES PARA EL CUIDADO EN EL HOGAR   Cumpla con las citas de control. Siga las indicaciones del mdico con respecto al uso de medicamentos, los ejercicios y la dieta.  Durante el embarazo debe obtener nutrientes para usted y para su beb. Consuma alimentos balanceados a intervalos regulares. Elija alimentos como carne, pescado, leche y otros productos lcteos descremados, vegetales, frutas, panes integrales y cereales. El mdico le informar  cul es el aumento de peso ideal.  Las relaciones sexuales pueden continuarse hasta casi el final del embarazo, si no se presentan otros problemas como prdida prematura (antes de tiempo) de lquido amnitico, hemorragia vaginal o dolor en el vientre (abdominal).  Realice actividad fsica todos los das, si no tiene restricciones. Consulte con el profesional que la asiste si no sabe con certeza si determinados ejercicios son seguros. El mayor aumento de peso se producir en los ltimos 2 trimestres del embarazo. El ejercicio ayuda a:  Controlar su peso.  Mantenerse en forma para el trabajo de parto y el parto .  Perder peso despus del parto.  Haga reposo con frecuencia, con las piernas elevadas, o segn lo necesite para evitar los calambres y el dolor de cintura.  Use un buen sostn o como los que se usan para hacer deportes para aliviar la sensibilidad de las mamas. Tambin puede serle til si lo usa mientras duerme. Si pierde calostro, podr utilizar apsitos en el sostn.  No utilice la baera con agua caliente, baos turcos y saunas.  Colquese el cinturn de seguridad cuando conduzca. Este la proteger a usted y al beb en caso de accidente.  Evite comer carne cruda y el contacto con los utensilios y desperdicios de los gatos. Estos elementos   contienen grmenes que pueden causar defectos de nacimiento en el beb.  Es fcil perder algo de orina durante el embarazo. Apretar y fortalecer los msculos de la pelvis la ayudar con este problema. Practique detener la miccin cuando est en el bao. Estos son los mismos msculos que necesita fortalecer. Son tambin los mismos msculos que utiliza cuando trata de evitar despedir gases. Puede practicar apretando estos msculos diez veces, y repetir esto tres veces por da aproximadamente. Una vez que conozca qu msculos debe apretar, no realice estos ejercicios durante la miccin. Puede favorecerle una infeccin si la orina vuelve hacia  atrs.  Pida ayuda si tienen necesidades financieras, teraputicas o nutricionales. El profesional podr ayudarla con respecto a estas necesidades, o derivarla a otros especialistas.  Haga una lista de nmeros telefnicos de emergencia y tngalos disponibles.  Planifique como obtener ayuda de familiares o amigos cuando regrese a casa desde el hospital.  Hacer un ensayo sobre la partida al hospital.  Tome clases prenatales con el padre para entender, practicar y hacer preguntas sobre el trabajo de parto y el alumbramiento.  Preparar la habitacin del beb / busque una guardera.  No viaje fuera de la ciudad a menos que sea absolutamente necesario y con el asesoramiento de su mdico.  Use slo zapatos de tacn bajo o sin tacn para tener mejor equilibrio y evitar cadas. USO DE MEDICAMENTOS Y CONSUMO DE DROGAS DURANTE EL EMBARAZO   Tome las vitaminas apropiadas para esta etapa tal como se le indic. Las vitaminas deben contener un miligramo de cido flico. Guarde todas las vitaminas fuera del alcance de los nios. La ingestin de slo un par de vitaminas o tabletas que contengan hierro pueden ocasionar la muerte en un beb o en un nio pequeo.  Evite el uso de todos los medicamentos, incluyendo hierbas, medicamentos de venta libre, sin receta o que no hayan sido sugeridos por su mdico. Slo tome medicamentos de venta libre o medicamentos recetados para el dolor, el malestar o fiebre como lo indique su mdico. No tome aspirina, ibuprofeno (Motrin, Advil, Nuprin) o naproxeno (Aleve) excepto que su mdico se lo indique.  Infrmele al profesional si consume alguna droga.  El alcohol se relaciona con ciertos defectos congnitos. Incluye el sndrome de alcoholismo fetal. Debe evitar absolutamente el consumo de alcohol, en cualquier forma. El fumar produce baja tasa de natalidad y bebs prematuros.  Las drogas ilegales o de la calle son muy perjudiciales para el beb. Estn absolutamente  prohibidas. Un beb que nace de una madre adicta, ser adicto al nacer. Ese beb tendr los mismos sntomas de abstinencia que un adulto. SOLICITE ATENCIN MDICA SI:  Tiene preguntas o preocupaciones relacionadas con el embarazo. Es mejor que llame para formular las preguntas si no puede esperar hasta la prxima visita, que sentirse preocupada por ellas.  DECISIONES ACERCA DE LA CIRCUNCISIN  Usted puede saber o no cul es el sexo de su beb. Si ya sabe que ser un varn, este es el momento de pensar acerca de la circuncisin. La circuncisin es la extirpacin del prepucio. Esta es la piel que cubre el extremo sensible del pene. No hay un motivo mdico que lo justifique. Generalmente la decisin se toma segn lo que sea popular en ese momento, o segn creencias religiosas. Podr conversar estos temas con su mdico o con el pediatra.  SOLICITE ATENCIN MDICA DE INMEDIATO SI:   La temperatura oral le sube a ms de 102 F (38.9 C) o lo que su mdico le   indique.  Tiene una prdida de lquido por la vagina (canal de parto). Si sospecha una ruptura de las membranas, tmese la temperatura y llame al profesional para informarlo sobre esto.  Observa unas pequeas manchas, una hemorragia vaginal o elimina cogulos. Notifique al profesional acerca de la cantidad y de cuntos apsitos est utilizando.  Presenta un olor desagradable en la secrecin vaginal y observa un cambio en el color, de transparente a blanco.  Ha vomitado durante ms de 24 horas.  Siente escalofros o le sube la fiebre.  Le falta el aire.  Siente ardor al orinar.  Baja o sube ms de 2 libras (900 g), o segn lo indicado por el profesional que la asiste.  Observa que sbitamente se le hinchan el rostro, las manos, los pies o las piernas.  Siente dolor en el vientre (abdominal). Las molestias en el ligamento redondo son una causa benigna frecuente de dolor abdominal durante el embarazo. El profesional que la asiste deber  evaluarla.  Presenta dolor de cabeza intenso que no se alivia.  Tiene problemas visuales, visin doble o borrosa.  Si no siente los movimientos del beb durante ms de 1 hora. Si piensa que el beb no se mueve tanto como lo haca habitualmente, coma algo que contenga azcar y recustese sobre el lado izquierdo durante una hora. El beb debe moverse al menos 4  5 veces por hora. Comunquese inmediatamente si el beb se mueve menos que lo indicado.  Se cae, se ve involucrada en un accidente automovilstico o sufre algn tipo de traumatismo.  En su hogar hay violencia mental o fsica. Document Released: 12/26/2004 Document Revised: 09/17/2011 ExitCare Patient Information 2013 [employer].  

## 2012-07-30 NOTE — Progress Notes (Signed)
Pain in her legs and around her belly button.

## 2012-07-30 NOTE — Progress Notes (Signed)
Pain around umbilicus - slightly red - triple antibiotic ointment. No chest pain/SOB. Following with cardiology. Platelets 81 on 4/17 (up from 72). Pt aware that she needs to deliver at [facility].

## 2012-08-13 ENCOUNTER — Ambulatory Visit (INDEPENDENT_AMBULATORY_CARE_PROVIDER_SITE_OTHER): Payer: Self-pay | Admitting: Family Medicine

## 2012-08-13 VITALS — BP 104/63 | Temp 97.6°F | Wt 167.3 lb

## 2012-08-13 DIAGNOSIS — I679 Cerebrovascular disease, unspecified: Secondary | ICD-10-CM

## 2012-08-13 DIAGNOSIS — I519 Heart disease, unspecified: Secondary | ICD-10-CM

## 2012-08-13 DIAGNOSIS — O0990 Supervision of high risk pregnancy, unspecified, unspecified trimester: Secondary | ICD-10-CM

## 2012-08-13 LAB — POCT URINALYSIS DIP (DEVICE)
Bilirubin Urine: NEGATIVE
Glucose, UA: NEGATIVE mg/dL
Specific Gravity, Urine: 1.01 (ref 1.005–1.030)
Urobilinogen, UA: 0.2 mg/dL (ref 0.0–1.0)

## 2012-08-13 NOTE — Patient Instructions (Signed)
Induccin del [place] de parto  (Labor Induction) La mayora de las mujeres comienza el trabajo de parto sin [place] las 37 y 42 semanas del Psychiatrist. Cuando esto no sucede, o cuando hay una necesidad mdica, pueden utilizarse medicamentos u otros mtodos para inducirlo. La induccin al parto hace que el tero de la mujer embarazada se [place]. Tambin hace que el cuello uterino se [place] (madure), se abra (se dilate), y se afine (se borre). Por lo general, el trabajo de parto no es inducido antes de las 39 semanas del embarazo excepto que haya un problema con el beb o de la [place].  Que en su caso sea inducido depende de ciertos factores, por ejemplo:   Su estado de salud y el de su beb.  Cuntas semanas tiene de [place].  El [place] de madurez de los pulmones del beb.  El estado del cuello uterino.  La posicin del beb. MOTIVOS PARA INDUCIR EL TRABAJO DE PARTO   La salud del beb o de la madre estn en riesgo.  El embarazo se ha pasado 1 semana o ms.  Se ha roto la bolsa de [place], pero el parto no empieza por s mismo.  La madre tiene un problema de salud o una enfermedad grave, como hipertensin arterial, infeccin, desprendimiento de la placenta o diabetes.  Hay poca cantidad de lquido amnitico alrededor del beb.  El beb tiene sufrimiento fetal. MOTIVOS PARA NO INDUCIR EL TRABAJO DE PARTO  La induccin del parto puede no ser Neomia Dear buena idea si:   Se observa que su beb no tolerar el trabajo de [place].  La induccin es slo ms conveniente.  Usted quiere que el beb nazca en una fecha determinada, como un da de [place].  Ha tenido cirugas previas en el tero, como una miomectoma o la extraccin de fibromas.  La placenta se encuentra muy baja en el tero y obstruye la abertura del cuello del tero (placenta previa).  El beb no est en posicin de cabeza.  El cordn umbilical cae por el canal del parto, frente al beb. En este caso podra cortarse el  suministro de sangre de oxgeno al beb.  Usted ha tenido antes un parto por cesrea.  Hay circunstancias inusuales, como que el beb sea [employer]. RIESGOS Y COMPLICACIONES  Pueden ocurrir problemas en el proceso de induccin y podr ser necesario modificar los planes segn desarrollo de los acontecimientos. Algunos de los riesgos de la induccin son:   Cambio en la frecuencia cardaca fetal, tales como que sea muy alta, muy baja, o errtica.  Riesgo de sufrimiento fetal.  Riesgo de infeccin para la madre y el beb.  Aumento de la posibilidad de tener un parto por cesrea.  Posibilidad rara, pero aumentada de que la placenta se separe del tero (desprendimiento).  Rotura uterina (muy raro). Cuando la induccin es necesaria por razones mdicas, los beneficios de la induccin pueden ser [employer].  ANTES DEL PROCEDIMIENTO  El mdico controlar el cuello y la posicin del beb. Esto lo ayudar a decidir si est en condiciones de una induccin al [employer].  PROCEDIMIENTO  Se pueden utilizar varios mtodos de induccin del [place], como:   Tomar medicamentos como prostaglandinas para dilatar y Customer service manager el cuello del tero. Este medicamento tambin iniciar las contracciones. Podr ser administrado por va oral o mediante la insercin de un supositorio en la vagina.  Podrn insertarle un tubo delgado (catter) con un globo en el extremo en la vagina para dilatar  el cuello del tero. Una vez insertado, el globo se expande con agua, lo que hace que el cuello uterino se abra.  Ruptura de las [place]. El mdico inserta un dedo entre el cuello del tero y las [place], lo que hace que el cuello del tero se estire y podr hacer que el tero se Technical sales engineer. Esto se suele hacer durante una visita al [facility]. Se la enviar a su casa a esperar a que las contracciones comiencen. Luego vendr para la induccin.  Ruptura de la bolsa de aguas. Su mdico har un Audiological scientist  amnitico con un instrumento pequeo. Una vez que se rompe el saco amnitico, las Therapist, occupational. Pueden pasar algunas horas para ver su efecto.  Tomar medicamentos para desencadenar o reforzar las contracciones. Este medicamento se administra por va intravenosa a travs de un tubo en el brazo. Todos los mtodos de induccin, adems la rotura de [place], se Futures trader hospital. La induccin se [place], de modo que usted y el beb sean atentamente controlados.  DESPUS DEL PROCEDIMIENTO  Algunas inducciones pueden llevar hasta 2 o 3 das. Dependiendo del cuello del tero, por lo general toma menos tiempo. Se tarda ms tiempo cuando se induce al inicio del Psychiatrist o si es Contractor. Si la madre est todava embarazada y han realizado la induccin durante 2 a 3 das, ser [employer] sea [place] a su casa o que tenga un parto por [place].  Document Released: 06/25/2007 Document Revised: 06/10/2011 [facility] Patient Information 2013 [place], [place].

## 2012-08-13 NOTE — Progress Notes (Signed)
Pt states she saw MFM in [facility] yesterday and plan is to induce at 39 weeks. No chest pain, palpitations, SOB. No other complaints today.

## 2012-08-13 NOTE — Progress Notes (Signed)
Pulse: 81

## 2012-08-26 ENCOUNTER — Ambulatory Visit (HOSPITAL_COMMUNITY): Payer: Self-pay

## 2012-08-27 ENCOUNTER — Encounter: Payer: Self-pay | Admitting: Obstetrics & Gynecology

## 2012-09-03 ENCOUNTER — Ambulatory Visit (INDEPENDENT_AMBULATORY_CARE_PROVIDER_SITE_OTHER): Payer: Self-pay | Admitting: Family Medicine

## 2012-09-03 VITALS — BP 103/69 | Wt 171.9 lb

## 2012-09-03 DIAGNOSIS — I2510 Atherosclerotic heart disease of native coronary artery without angina pectoris: Secondary | ICD-10-CM

## 2012-09-03 DIAGNOSIS — I519 Heart disease, unspecified: Secondary | ICD-10-CM

## 2012-09-03 DIAGNOSIS — O99119 Other diseases of the blood and blood-forming organs and certain disorders involving the immune mechanism complicating pregnancy, unspecified trimester: Secondary | ICD-10-CM

## 2012-09-03 DIAGNOSIS — D689 Coagulation defect, unspecified: Secondary | ICD-10-CM

## 2012-09-03 DIAGNOSIS — O99419 Diseases of the circulatory system complicating pregnancy, unspecified trimester: Secondary | ICD-10-CM

## 2012-09-03 DIAGNOSIS — O09299 Supervision of pregnancy with other poor reproductive or obstetric history, unspecified trimester: Secondary | ICD-10-CM

## 2012-09-03 DIAGNOSIS — O0990 Supervision of high risk pregnancy, unspecified, unspecified trimester: Secondary | ICD-10-CM

## 2012-09-03 DIAGNOSIS — D696 Thrombocytopenia, unspecified: Secondary | ICD-10-CM

## 2012-09-03 DIAGNOSIS — I499 Cardiac arrhythmia, unspecified: Secondary | ICD-10-CM

## 2012-09-03 DIAGNOSIS — O98219 Gonorrhea complicating pregnancy, unspecified trimester: Secondary | ICD-10-CM

## 2012-09-03 LAB — POCT URINALYSIS DIP (DEVICE)
Glucose, UA: NEGATIVE mg/dL
Hgb urine dipstick: NEGATIVE
Nitrite: NEGATIVE
Urobilinogen, UA: 0.2 mg/dL (ref 0.0–1.0)
pH: 6.5 (ref 5.0–8.0)

## 2012-09-03 NOTE — Patient Instructions (Signed)
Parto vaginal  (Vaginal Delivery) En primer lugar, su mdico debe estar seguro de que usted est en trabajo de parto. Algunos signos son:  Puede haber eliminado el "tapn mucoso" antes que comience el trabajo de parto. Se trata de una pequea cantidad de mucus con sangre.  Tiene contracciones uterinas regulares.  El tiempo entre las contracciones se acorta.  Las molestias y el dolor se hacen gradualmente ms intensos.  El dolor se ubica principalmente en la espalda.  Los dolores empeoran al caminar.  El cuello del tero (la apertura del tero) se hace ms delgada, comienza a borrarse, y se abre (se dilata). Una vez que se encuentre en trabajo de parto y sea admitida en el hospital, el mdico har lo siguiente:  Un examen fsico completo.  Controlar sus signos vitales (presin arterial, pulso, temperatura y la frecuencia cardaca fetal).  Realizar un examen vaginal (usando un guante estril y lubricante) para determinar:  La posicin (presentacin) del beb (ceflica [vertex] o nalgas primero).  El nivel (plano) de la cabeza del beb en el canal de parto.  El borramiento y dilatacin del cuello del tero.  Le rasurarn el vello pbico y le aplicarn una enema segn lo considere el mdico y las circunstancias.  Generalmente se coloca un monitor electrnico sobre el abdomen. El monitor sigue la duracin e intensidad de las contracciones, as como la frecuencia cardaca del beb.  Generalmente, el profesional inserta una va intravenosa en el brazo para administrarle agua azucarada. Esta es una medida de precaucin, de modo que puedan administrarle rpidamente medicamentos durante el trabajo de parto. EL TRABAJO DE PARTO Y PARTO NORMALES SE DIVIDEN EN 3 ETAPAS: Primera etapa Comienzan las contracciones regulares y el cuello comienza a borrarse y dilatarse. Esta etapa puede durar entre 3 y 15 horas. El final de la primera etapa se considera cuando el cuello est borrado en un 100%  y se ha dilatado 10 cm. Le administrarn analgsicos por:  Inyeccin (morfina, demerol, etc.).  Anestesia regional (espinal, caudal o epidural, anestsicos colocados en diferentes regiones de la columna vertebral). Podrn administrarle medicamentos para el dolor en la regin paracervical, que consiste en la aplicacin de un anestsico inyectable en cada uno de los lados del cuello del tero. La embarazada puede requerir un "parto natural", es decir no recibir medicamentos o anestesia durante el trabajo de parto y el parto. Segunda etapa En este momento el beb baja a travs del canal de parto (vagina) y nace. Esto puede durar entre 1 y 4 horas. A medida que el beb asoma la cabeza por el canal de parto, podr sentir una sensacin similar a cuando mueve el intestino. Sentir el impulse de empujar con fuerza hasta que el nio salga. A medida que la cabecita baja, el mdico decidir si realiza una episiotoma (corte en el perineo y rea de la vagina) para evitar la ruptura de los tejidos. Luego del nacimiento del beb y la expulsin de la placenta, la episiotoma se sutura. En algunos casos se coloca a la madre una mscara con xido nitroso para facilitar la respiracin y aliviar el dolor. El final de la etapa 2 se produce cuando el beb ha salido completamente. Luego, cuando el cordn umbilical deja de pulsar, se pinza y se corta. Tercera etapa La tercera etapa comienza luego que el beb ha nacido y finaliza luego de la expulsin de la placenta. Generalmente esto lleva entre 5 y 30 minutos. Luego de la expulsin de la placenta, le aplicarn un medicamento por   va intravenosa para ayudar a contraer el tero y prevenir hemorragias. En la tercera etapa no hay dolor y generalmente no son necesarios los analgsicos. Si le han realizado una episiotoma, es el momento de repararla. Luego del parto, la mam es observada y controlada exhaustivamente durante 1  2 horas para verificar que no hay sangrado en el post  parto (hemorragias). Si pierde mucha sangre, le administrarn un medicamento para contraer el tero y detener la hemorragia. Document Released: 02/29/2008 Document Revised: 12/11/2011 ExitCare Patient Information 2014 [employer].  

## 2012-09-03 NOTE — Progress Notes (Signed)
No complaints. Had infusion at [facility] last week for low platelets and on prednisone. Induction at [facility] at 59 w due to maternal cardiac issues.

## 2012-09-03 NOTE — Progress Notes (Signed)
Pulse 73 Needs GBS and GC/Ch

## 2012-09-04 LAB — GC/CHLAMYDIA PROBE AMP: CT Probe RNA: NEGATIVE

## 2012-09-07 ENCOUNTER — Encounter: Payer: Self-pay | Admitting: Family Medicine

## 2012-09-07 LAB — CULTURE, BETA STREP (GROUP B ONLY)

## 2012-09-10 ENCOUNTER — Encounter: Payer: Self-pay | Admitting: Obstetrics and Gynecology

## 2012-10-15 ENCOUNTER — Encounter: Payer: Self-pay | Admitting: Obstetrics & Gynecology

## 2012-10-15 ENCOUNTER — Ambulatory Visit (INDEPENDENT_AMBULATORY_CARE_PROVIDER_SITE_OTHER): Payer: Self-pay | Admitting: Obstetrics & Gynecology

## 2012-10-15 DIAGNOSIS — Z309 Encounter for contraceptive management, unspecified: Secondary | ICD-10-CM

## 2012-10-15 MED ORDER — NORETHINDRONE 0.35 MG PO TABS: 1.0000 | ORAL_TABLET | Freq: Every day | ORAL | Status: DC

## 2012-10-15 NOTE — Progress Notes (Signed)
  Subjective:     Regina Phelps is a 32 y.o. [id number] female who presents for a postpartum visit. Patient is Spanish-speaking only, Spanish interpreter present for this encounter. She is 6 weeks postpartum following a spontaneous vaginal delivery after IOL for maternal cardiac issues at [facility].  There are no records available during this visit.  Postpartum course has been uncomplicated. Baby's course has been uncomplicated. Baby is feeding by breast. Bleeding: no bleeding. Bowel function is normal. Bladder function is normal. Patient is not sexually active. Contraception method is condoms and oral progesterone-only contraceptive. Postpartum depression screening: negative.  The following portions of the patient's history were reviewed and updated as appropriate: allergies, current medications, past family history, past medical history, past social history, past surgical history and problem list. Pap on 05/18/12 was normal.  Review of Systems Pertinent items are noted in HPI.   Objective:    LMP 12/18/2011  General:  alert and no distress  Lungs: clear to auscultation bilaterally  Heart:  regular rate and rhythm  Abdomen: soft, non-tender; bowel sounds normal; no masses,  no organomegaly   Vulva:  normal  Vagina: normal vagina  Cervix:  normal  Corpus: normal size, contour, position, consistency, mobility, non-tender  Adnexa:  normal adnexa  Rectal Exam: Normal rectovaginal exam        Assessment:   Normal postpartum exam. Pap smear not done at today's visit.   Plan:   1. Contraception: condoms and oral progesterone-only contraceptive; POP prescribed 2. Will follow up with cardiologist 3. Routine preventative health maintenance measures emphasized

## 2012-10-15 NOTE — Patient Instructions (Signed)
Regrese a la clinica cuando tenga su cita. Si tiene problemas o preguntas, llama a la clinica o vaya a la sala de emergencia al [facility].    

## 2013-07-05 IMAGING — US US OB DETAIL+14 WK
2 series · 12 of 28 positions shown · non-contrast
Comparison: none

[Series 1: us ob detail +14 wk · 1 of 8 slices shown (1 of 2)]
[im 8/8]
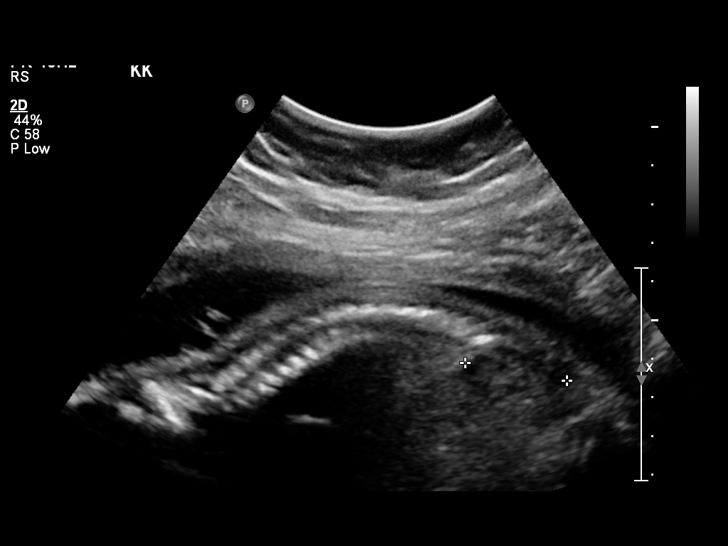

[Series 1: us ob detail +14 wk · 11 of 92 slices shown (2 of 2)]
[im 4/92]
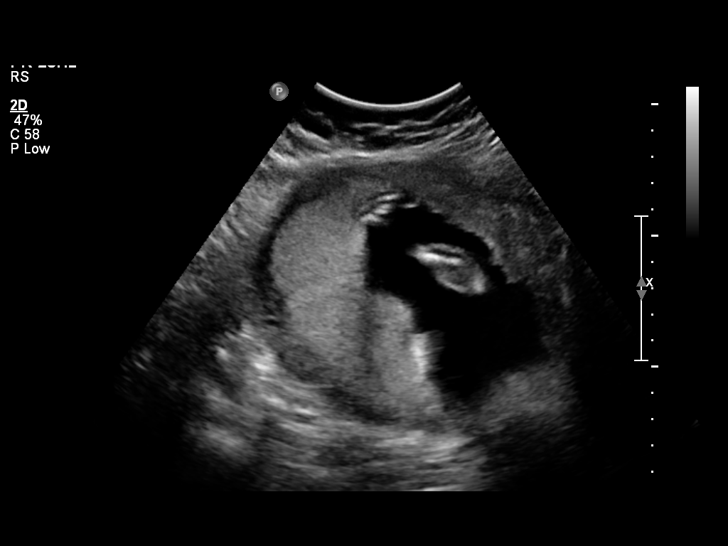
[im 11/92]
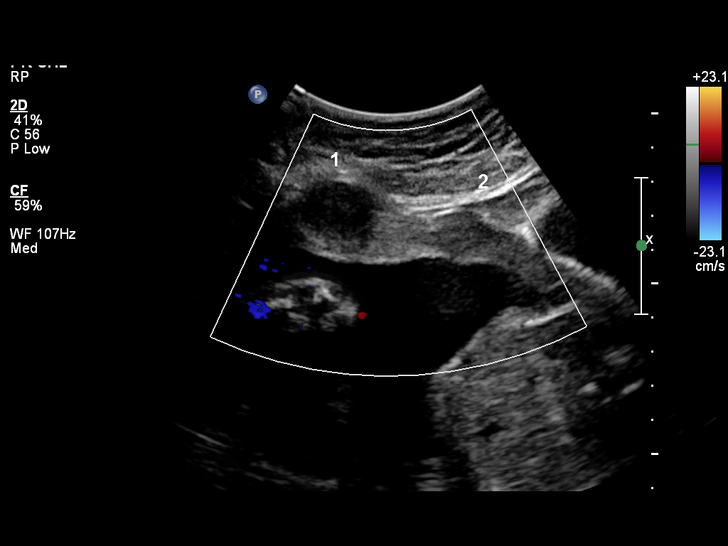
[im 22/92]
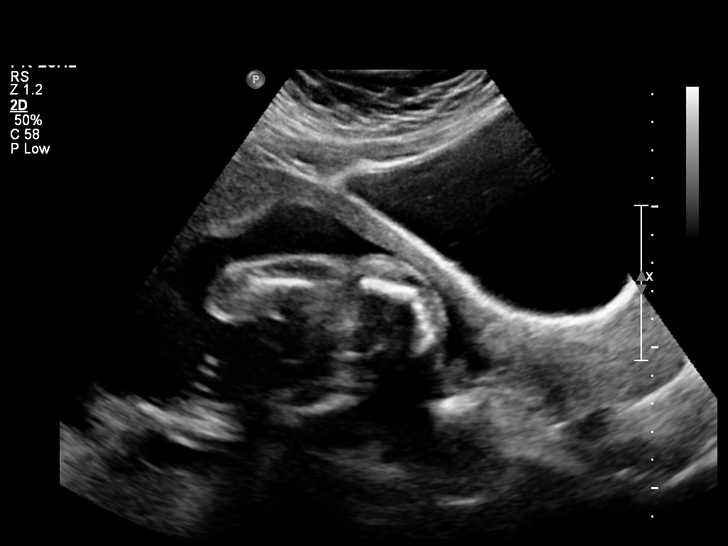
[im 30/92]
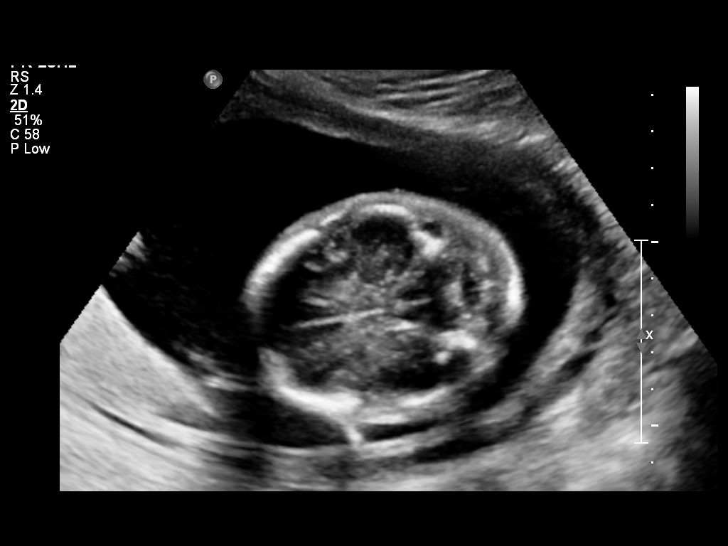
[im 37/92]
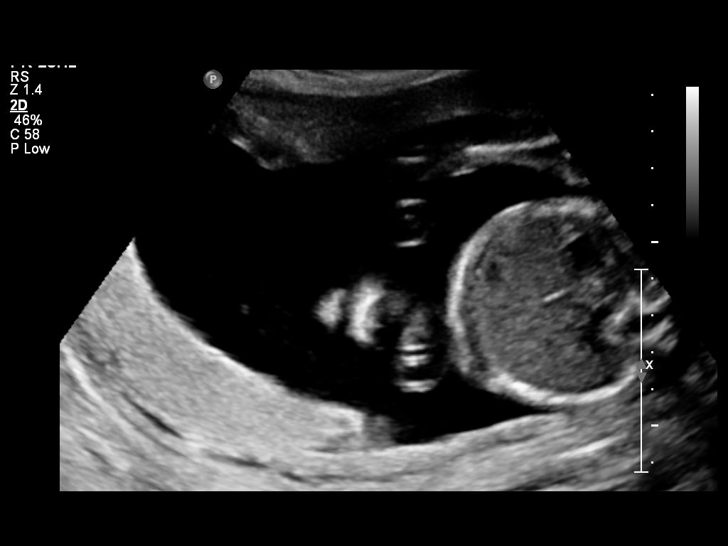
[im 48/92]
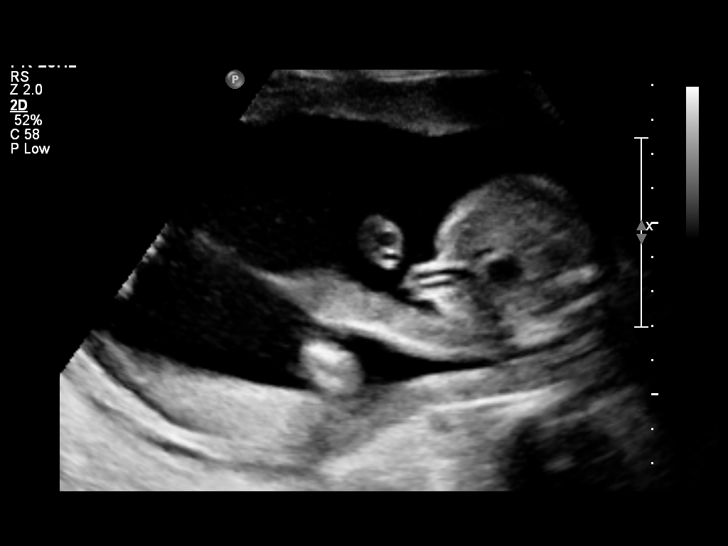
[im 55/92]
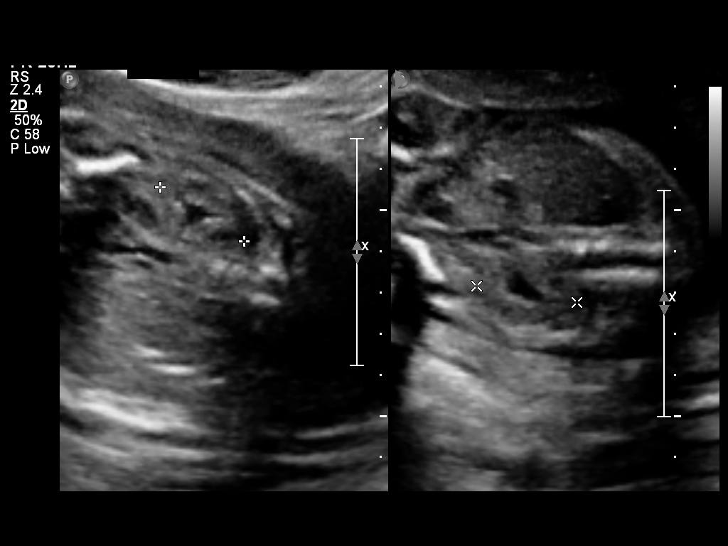
[im 62/92]
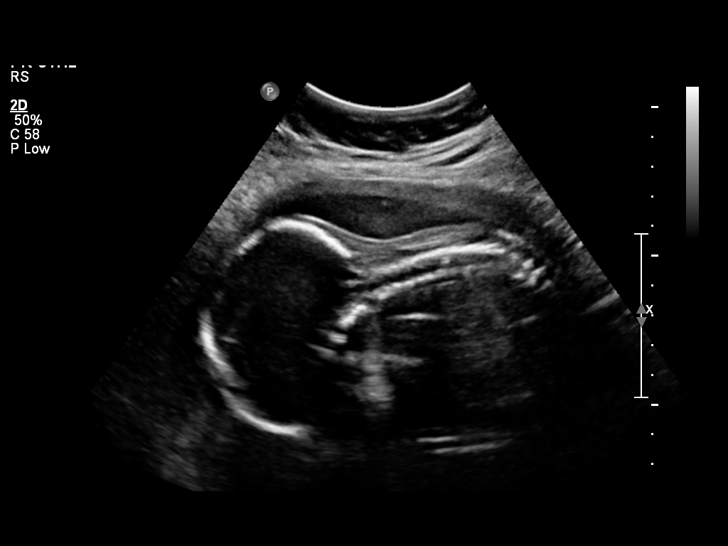
[im 73/92]
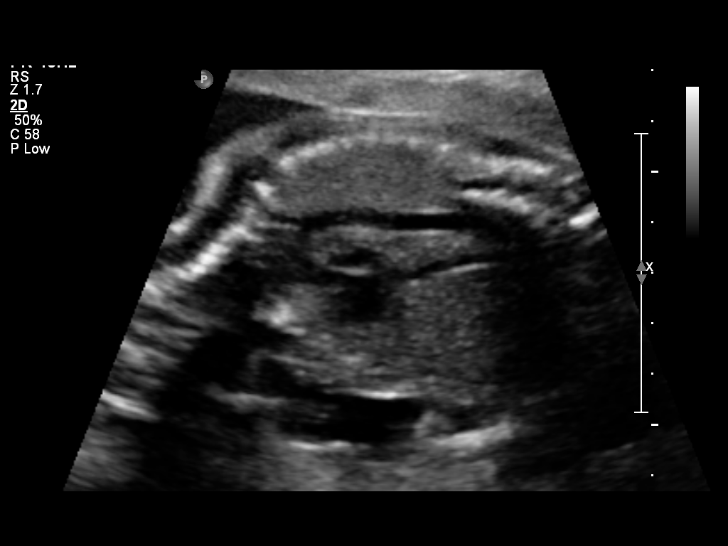
[im 81/92]
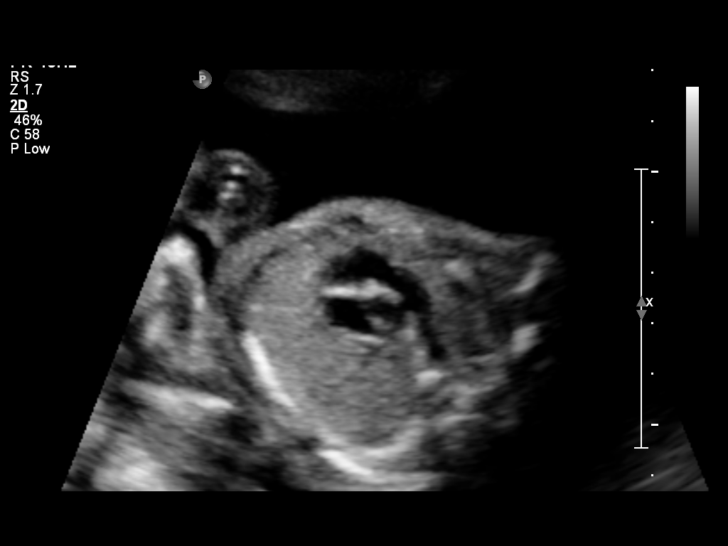
[im 88/92]
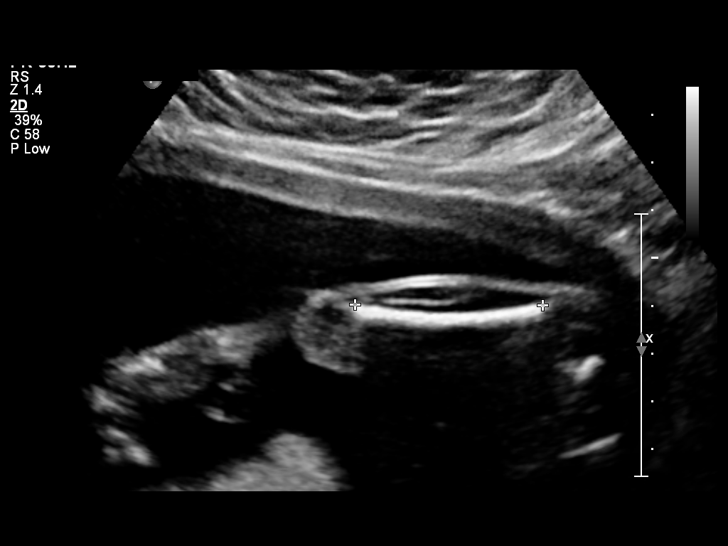

[12 of 28 positions shown; findings below may reference images not displayed]

OBSTETRICS REPORT
                      (Signed Final 05/20/2012 [DATE])

             MOATSHE

Service(s) Provided

 US OB DETAIL + 14 WK                                  76811.0
Indications

 Detailed fetal anatomic survey
 Unsure of LMP;  Establish Gestational [AGE]
Fetal Evaluation

 Num Of Fetuses:    1
 Fetal Heart Rate:  138                         bpm
 Cardiac Activity:  Observed
 Presentation:      Breech
 Placenta:          Posterior, above cervical
                    os
 P. Cord            Visualized
 Insertion:

 Amniotic Fluid
 AFI FV:      Subjectively within normal limits
                                             Larg Pckt:     9.2  cm
Biometry

 BPD:     57.6  mm    G. Age:   23w 4d                CI:        72.71   70 - 86
                                                      FL/HC:      18.6   19.2 -

 HC:     214.8  mm    G. Age:   23w 4d       44  %    HC/AC:      1.16   1.05 -

 AC:     184.6  mm    G. Age:   23w 2d       41  %    FL/BPD:     69.4   71 - 87
 FL:        40  mm    G. Age:   22w 6d       26  %    FL/AC:      21.7   20 - 24
 HUM:     39.2  mm    G. Age:   24w 0d       57  %
 CER:     23.2  mm    G. Age:   21w 4d       15  %

 Est. FW:     570  gm      1 lb 4 oz     51  %
Gestational Age

 LMP:           22w 0d       Date:   12/18/11                 EDD:   09/23/12
 U/S Today:     23w 2d                                        EDD:   09/14/12
 Best:          23w 2d    Det. By:   U/S (05/20/12)           EDD:   09/14/12
Anatomy
 Cranium:          Appears normal         Aortic Arch:      Appears normal
 Fetal Cavum:      Appears normal         Ductal Arch:      Appears normal
 Ventricles:       Appears normal         Diaphragm:        Appears normal
 Choroid Plexus:   Appears normal         Stomach:          Appears normal, left
                                                            sided
 Cerebellum:       Appears normal         Abdomen:          Appears normal
 Posterior Fossa:  Appears normal         Abdominal Wall:   Appears nml (cord
                                                            insert, abd wall)
 Nuchal Fold:      Appears normal         Cord Vessels:     Appears normal (3
                                                            vessel cord)
 Face:             Appears normal         Kidneys:          Appear normal
                   (orbits and profile)
 Lips:             Appears normal         Bladder:          Appears normal
 Heart:            Not well visualized    Spine:            Appears normal
 RVOT:             Not well visualized    Lower             Visualized
                                          Extremities:
 LVOT:             Appears normal         Upper             Visualized
                                          Extremities:

 Other:  Fetus appears to be a male. Heels and Left 5th digit visualized.
Targeted Anatomy

 Fetal Central Nervous System
 Lat. Ventricles:  6.2                    Cisterna Magna:
Cervix Uterus Adnexa

 Cervical Length:   4.2       cm

 Cervix:       Normal appearance by transabdominal scan.

 Left Ovary:   Size(cm) L: 2.78 x W: 2.2 x H: 1.5  Volume(cc):
 Right Ovary:  Size(cm) L: 2.44 x W: 2.05 x H: 1.15  Volume(cc): 3
 Adnexa:     No abnormality visualized.
Impression

 Single living intrauterine pregnancy in breech presentation.
 The estimated gestational age is 23w 2d based on U/S
 (05/20/12). No fetal anomalies are identified.

 CHARLES with us.  Please do not hesitate to

## 2013-08-16 IMAGING — US US OB FOLLOW-UP
1 series · 12 of 28 positions shown · non-contrast
Comparison: none

[Series 1: us ob follow-up · 12 of 63 slices shown]
[im 3/63]
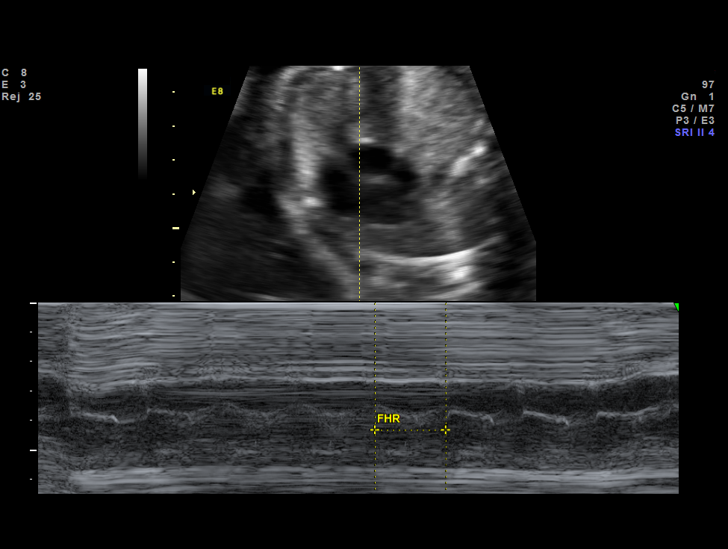
[im 7/63]
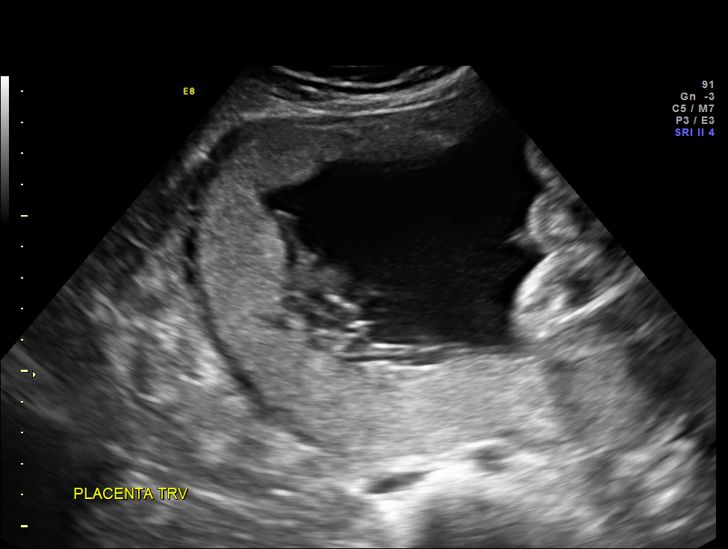
[im 12/63]
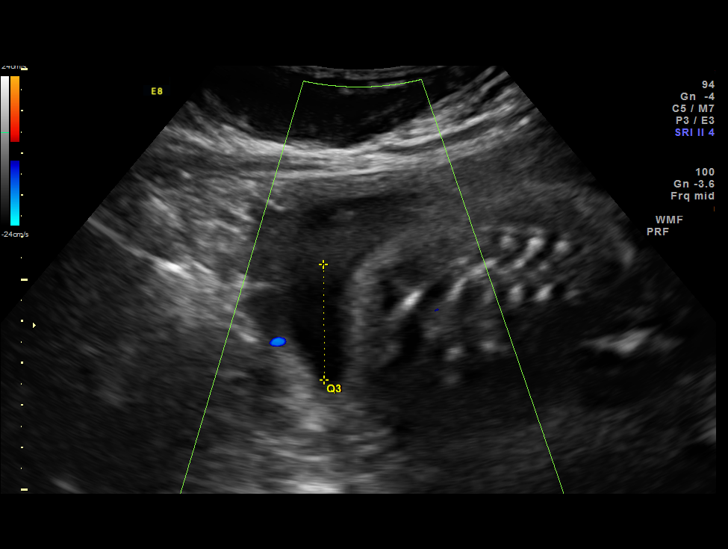
[im 19/63]
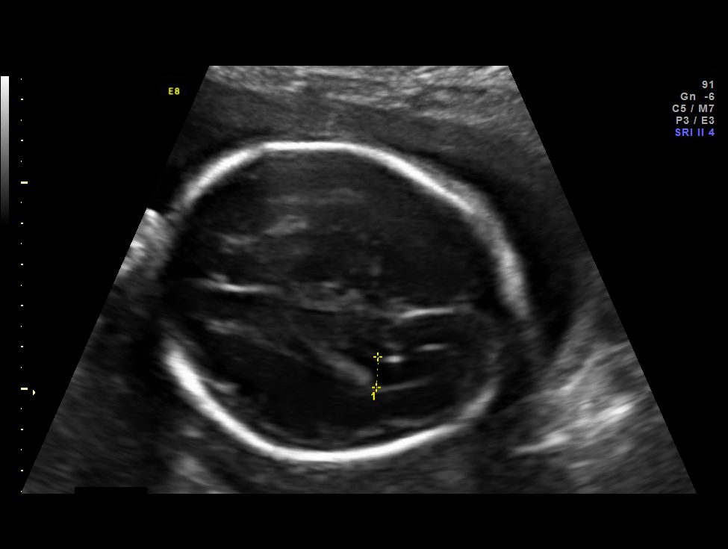
[im 23/63]
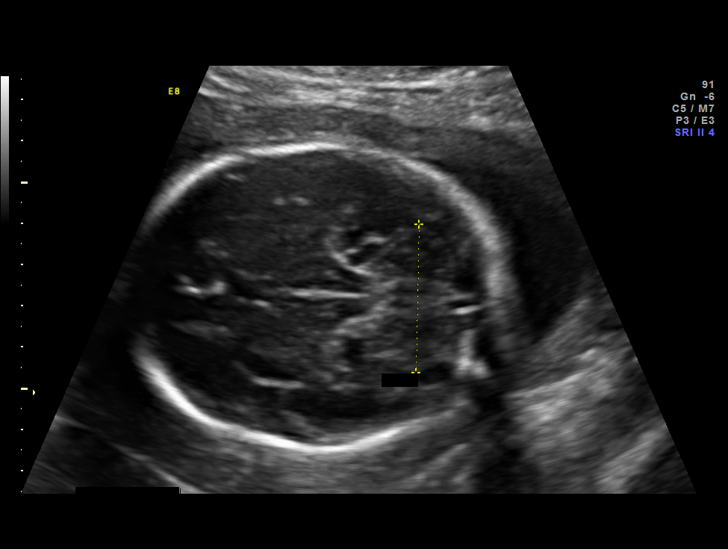
[im 28/63]
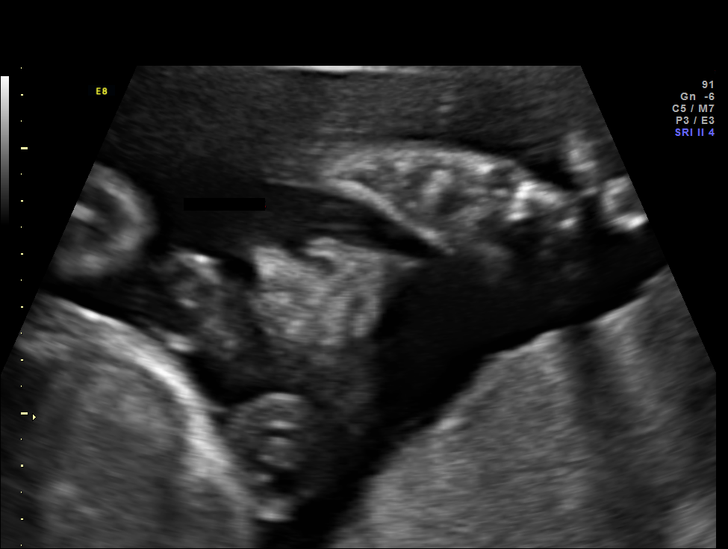
[im 35/63]
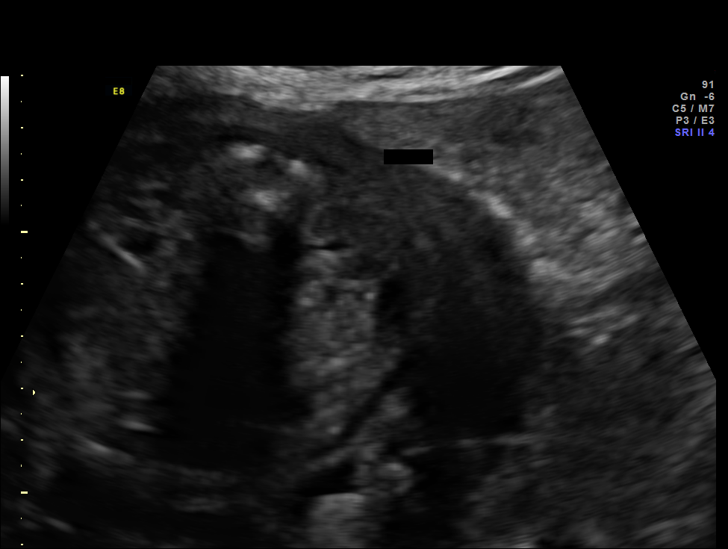
[im 40/63]
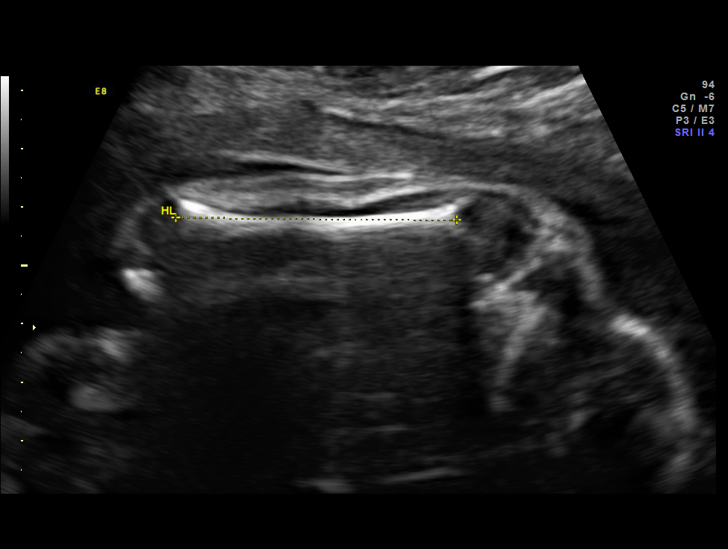
[im 44/63]
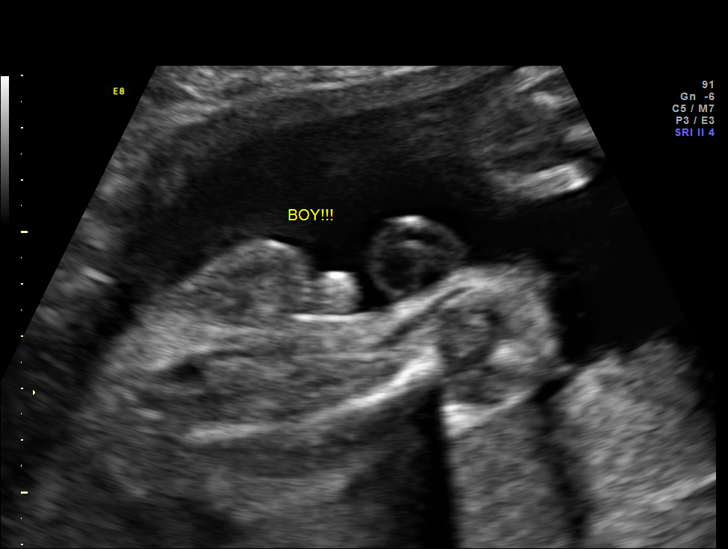
[im 51/63]
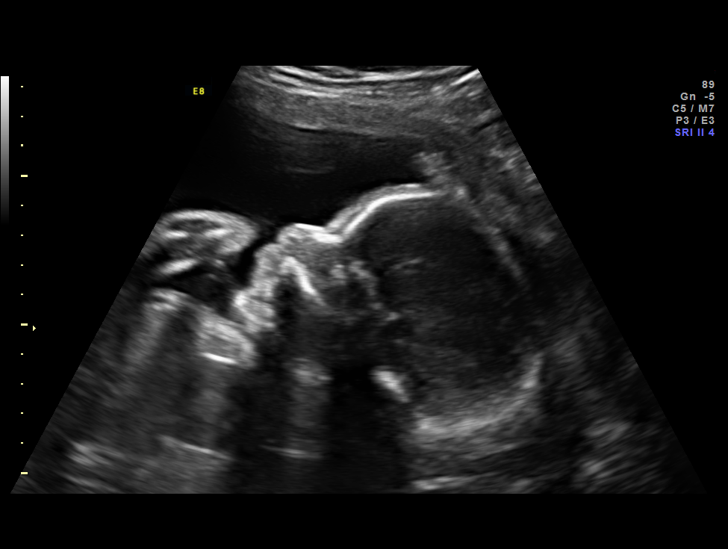
[im 56/63]
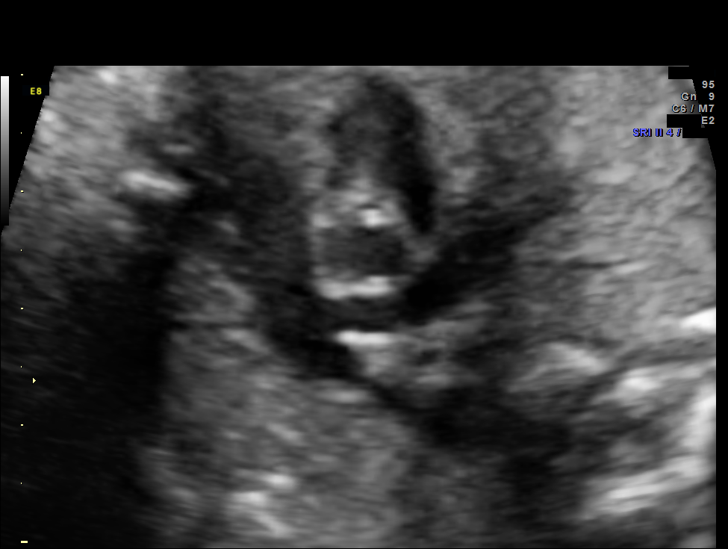
[im 60/63]
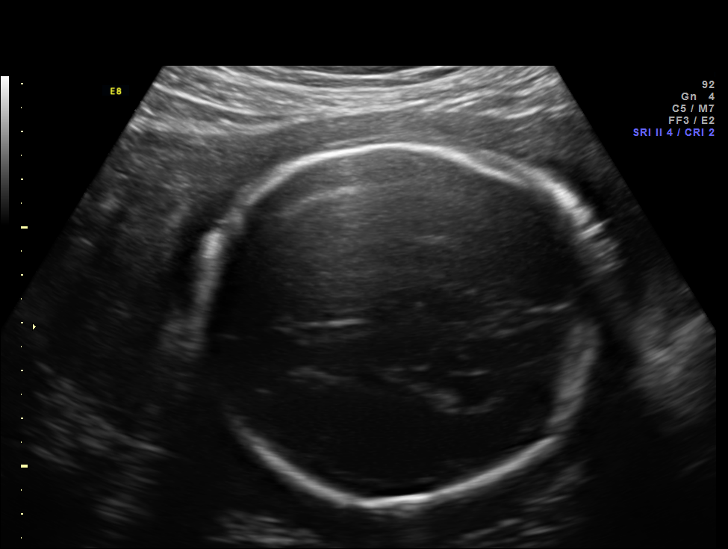

[12 of 28 positions shown; findings below may reference images not displayed]

OBSTETRICS REPORT
                      (Signed Final 07/02/2012 [DATE])

             RAYRAY

Service(s) Provided

 US OB FOLLOW UP                                       76816.1
Indications

 History of genetic / anatomic abnormality - pt with
 mitral stenosis s/p mitral valvuloplasty; currently
 with moderate MS and normal LV function
Fetal Evaluation

 Num Of Fetuses:    1
 Fetal Heart Rate:  142                          bpm
 Cardiac Activity:  Observed
 Presentation:      Cephalic
 Placenta:          Posterior, above cervical
                    os
 P. Cord            Visualized
 Insertion:

 Amniotic Fluid
 AFI FV:      Subjectively within normal limits
 AFI Sum:     20.43   cm       81  %Tile     Larg Pckt:    7.65  cm
 RUQ:   7.65    cm   RLQ:    3.63   cm    LUQ:   6.4     cm   LLQ:    2.75   cm
Biometry

 BPD:     75.1  mm     G. Age:  30w 1d                CI:         79.9   70 - 86
 OFD:       94  mm                                    FL/HC:      19.8   19.6 -

 HC:     269.7  mm     G. Age:  29w 3d       21  %    HC/AC:      1.02   0.99 -

 AC:     263.7  mm     G. Age:  30w 3d       78  %    FL/BPD:     71.0   71 - 87
 FL:      53.3  mm     G. Age:  28w 2d       13  %    FL/AC:      20.2   20 - 24
 HUM:       48  mm     G. Age:  28w 1d       24  %
 CER:       36  mm     G. Age:  31w 0d       77  %

 Est. FW:    5408  gm      3 lb 3 oz     59  %
Gestational Age

 LMP:           28w 0d        Date:  12/18/11                 EDD:   09/23/12
 U/S Today:     29w 4d                                        EDD:   09/12/12
 Best:          29w 2d     Det. By:  U/S (05/20/12)           EDD:   09/14/12
Anatomy

 Cranium:          Appears normal         Aortic Arch:      Previously seen
 Fetal Cavum:      Appears normal         Ductal Arch:      Previously seen
 Ventricles:       Appears normal         Diaphragm:        Appears normal
 Choroid Plexus:   Appears normal         Stomach:          Appears normal, left
                                                            sided
 Cerebellum:       Appears normal         Abdomen:          Appears normal
 Posterior Fossa:  Appears normal         Abdominal Wall:   Previously seen
 Nuchal Fold:      Previously seen        Cord Vessels:     Previously seen
 Face:             Appears normal         Kidneys:          Appear normal
                   (orbits and profile)
 Lips:             Appears normal         Bladder:          Appears normal
 Heart:            Appears normal         Spine:            Previously seen
                   (4CH, axis, and
                   situs)
 RVOT:             Appears normal         Lower             Previously seen
                                          Extremities:
 LVOT:             Appears normal         Upper             Previously seen
                                          Extremities:

 Other:  Fetus appears to be a male. Heels and Left 5th digit previously
         visualized. Nasal bone visualized.
Targeted Anatomy

 Fetal Central Nervous System
 Cisterna Magna:
Cervix Uterus Adnexa

 Cervical Length:    3.8      cm

 Cervix:       Normal appearance by transabdominal scan.

 Adnexa:     No abnormality visualized.
Comments

 Ms. Knutson denies shortness of breath, chest pain
 and palpitations. She understands to call with any of these
 symptoms.
Impression

 IUP at 29+2 weeks
 Normal interval anatomy; anatomic survey complete
 Normal amniotic fluid volume
 Appropriate interval growth with EFW at the 59th %tile
Recommendations

 Follow-up ultrasound for growth in 4 weeks
 Will arrange for a one time transfer of care visit in [place] at the
 [facility] soon

 ZEINAB with us.  Please do not hesitate to

## 2013-09-13 IMAGING — US US FETAL BPP W/O NONSTRESS
1 series · 12 of 28 positions shown · non-contrast
Comparison: none

[Series 1: us fetal bpp w/o nonstress · 0.23mm/px · 51 acquisitions, 12 frames shown]
[im 2/51]
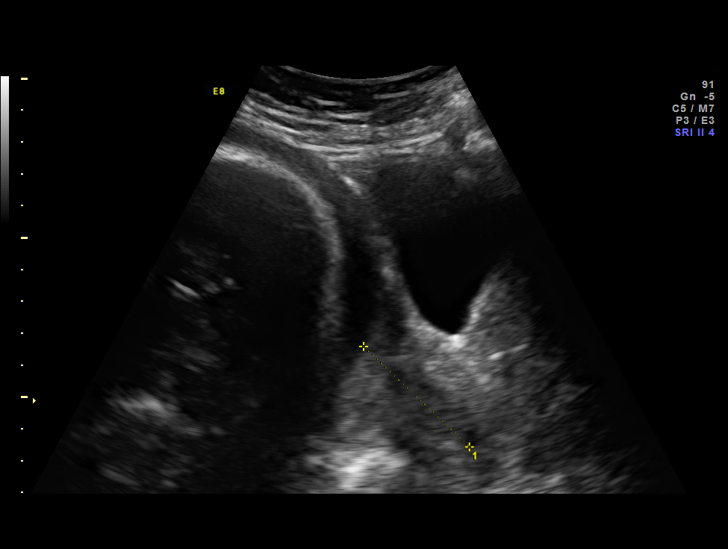
[im 6/51]
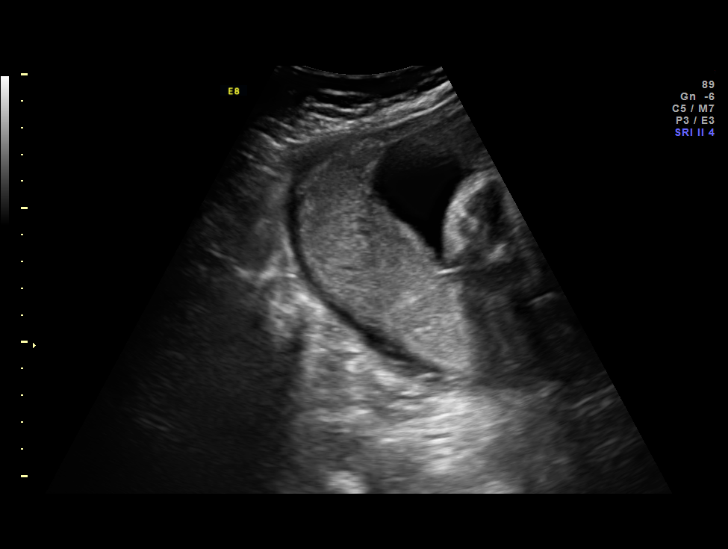
[im 10/51]
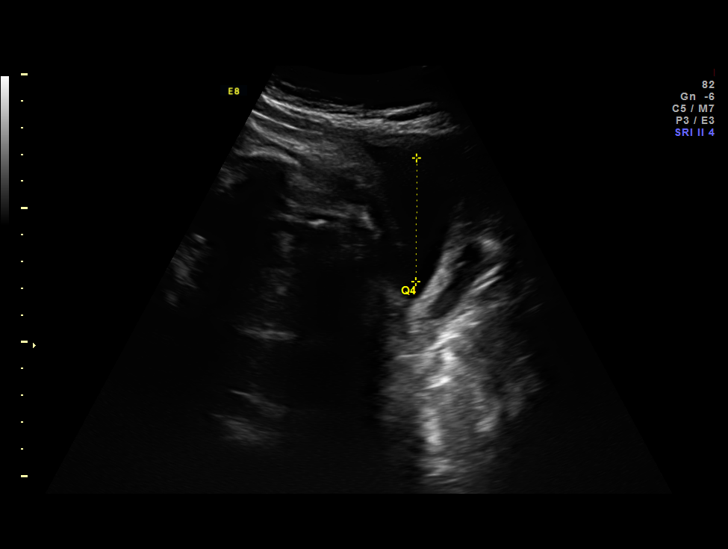
[im 15/51]
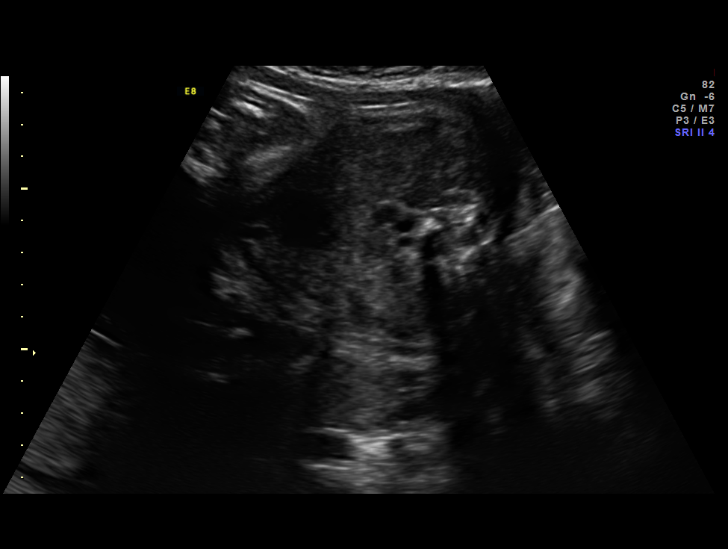
[im 19/51]
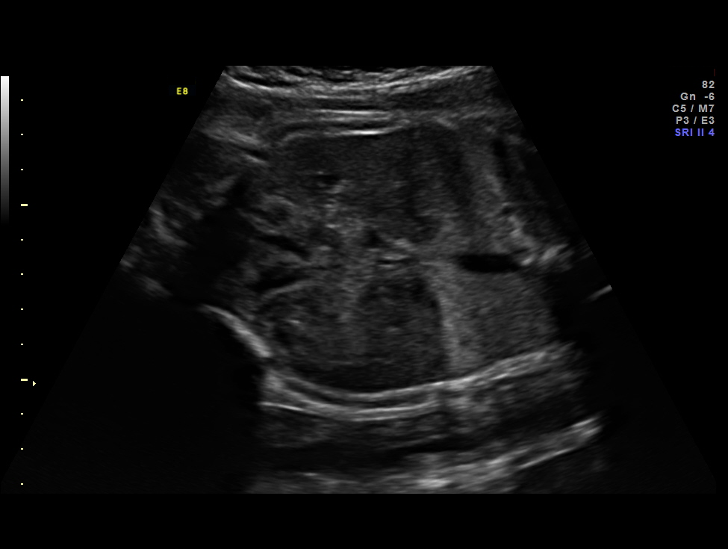
[im 23/51]
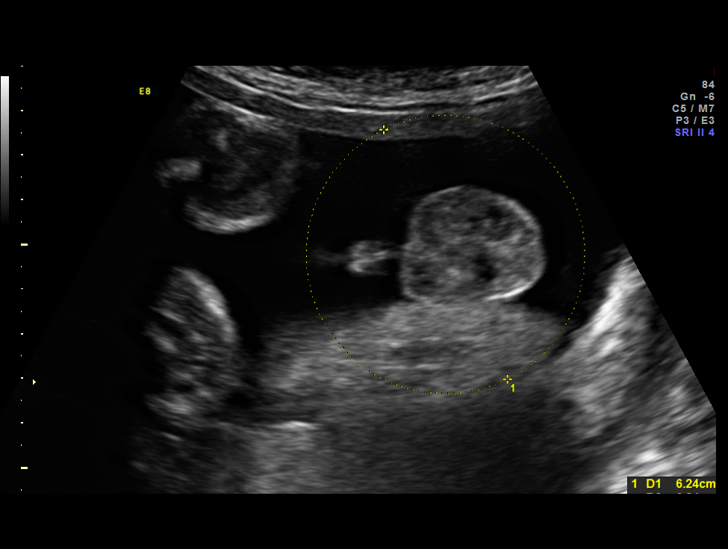
[im 28/51]
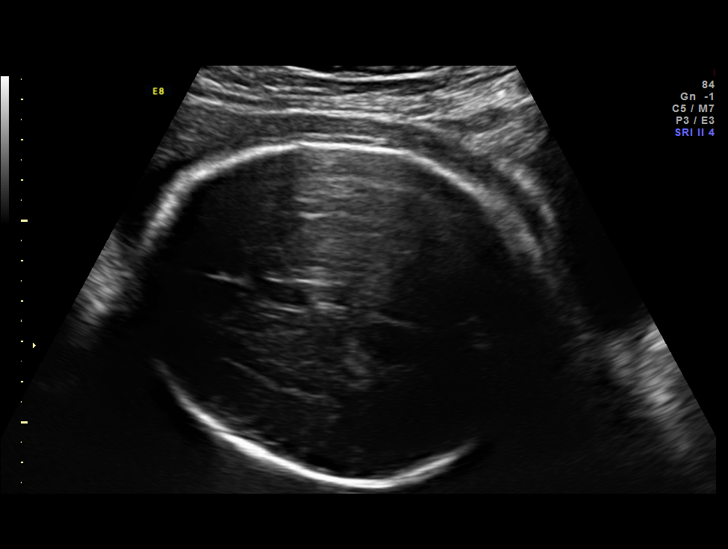
[im 32/51]
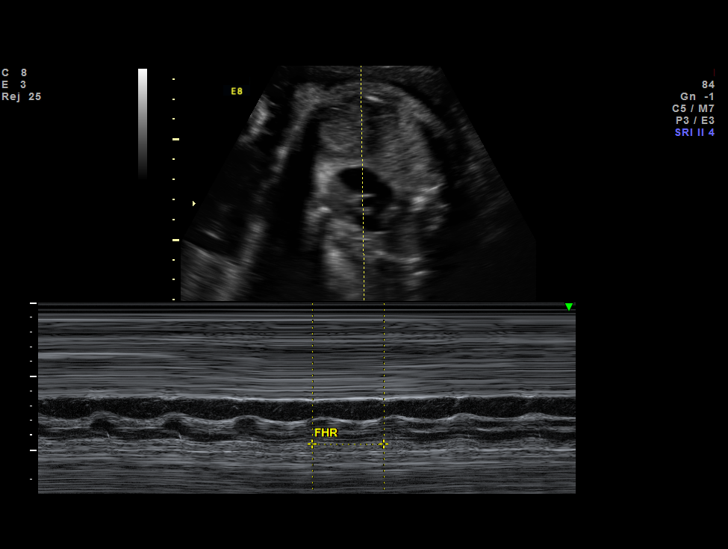
[im 36/51]
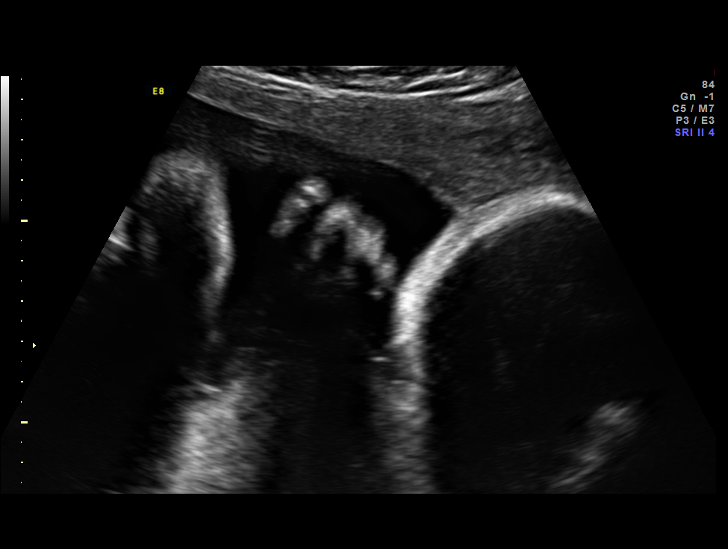
[im 41/51]
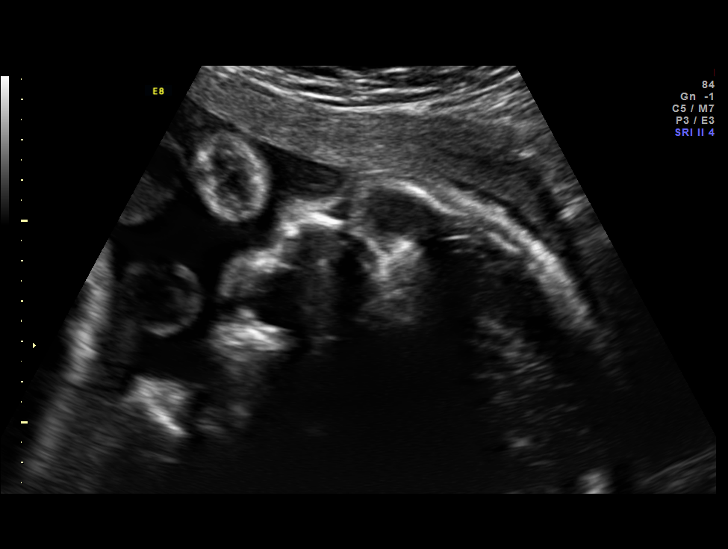
[im 45/51]
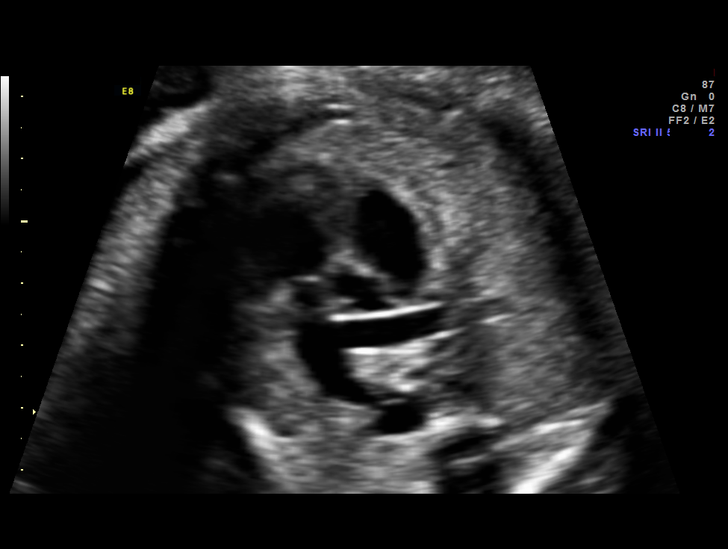
[im 49/51]
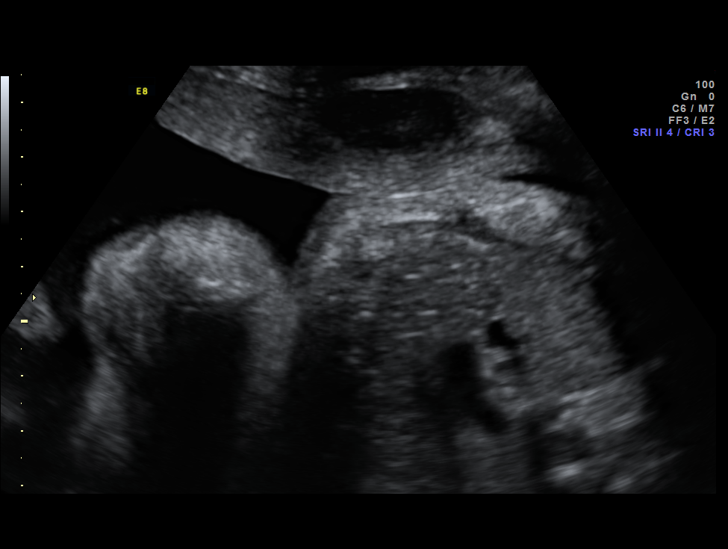

[12 of 28 positions shown; findings below may reference images not displayed]

OBSTETRICS REPORT
                      (Signed Final 07/30/2012 [DATE])

             SEOANE

Service(s) Provided

 US OB FOLLOW UP                                       76816.1
Indications

 History of genetic / anatomic abnormality - pt with
 mitral stenosis s/p mitral valvuloplasty; currently
 with moderate MS and normal LV function
Fetal Evaluation

 Num Of Fetuses:    1
 Fetal Heart Rate:  141                          bpm
 Cardiac Activity:  Observed
 Presentation:      Cephalic
 Placenta:          Posterior, above cervical
                    os
 P. Cord            Previously Visualized
 Insertion:

 Amniotic Fluid
 AFI FV:      Subjectively within normal limits
 AFI Sum:     19.75   cm       74  %Tile     Larg Pckt:    6.21  cm
 RUQ:   6.21    cm   RLQ:    4.62   cm    LUQ:   4.8     cm   LLQ:    4.12   cm
Biophysical Evaluation

 Amniotic F.V:   Within normal limits       F. Tone:        Observed
 F. Movement:    Observed                   Score:          [DATE]
 F. Breathing:   Observed
Biometry

 BPD:     82.5  mm     G. Age:  33w 1d                CI:         75.8   70 - 86
 OFD:    108.8  mm                                    FL/HC:      19.6   19.9 -

 HC:     302.8  mm     G. Age:  33w 5d       23  %    HC/AC:      1.04   0.96 -

 AC:     292.2  mm     G. Age:  33w 2d       50  %    FL/BPD:     72.1   71 - 87
 FL:      59.5  mm     G. Age:  31w 0d      < 3  %    FL/AC:      20.4   20 - 24
 HUM:     55.3  mm     G. Age:  32w 1d       36  %

 Est. FW:    2452  gm      4 lb 7 oz     45  %
Gestational Age

 LMP:           32w 0d        Date:  12/18/11                 EDD:   09/23/12
 U/S Today:     32w 5d                                        EDD:   09/18/12
 Best:          33w 2d     Det. By:  U/S (05/20/12)           EDD:   09/14/12
Anatomy

 Cranium:          Appears normal         Aortic Arch:      Previously seen
 Fetal Cavum:      Appears normal         Ductal Arch:      Previously seen
 Ventricles:       Appears normal         Diaphragm:        Appears normal
 Choroid Plexus:   Previously seen        Stomach:          Appears normal, left
                                                            sided
 Cerebellum:       Previously seen        Abdomen:          Appears normal
 Posterior Fossa:  Previously seen        Abdominal Wall:   Appears nml (cord
                                                            insert, abd wall)
 Nuchal Fold:      Previously seen        Cord Vessels:     Appears normal (3
                                                            vessel cord)
 Face:             Appears normal         Kidneys:          Appear normal
                   (orbits and profile)
 Lips:             Appears normal         Bladder:          Appears normal
 Heart:            Appears normal         Spine:            Previously seen
                   (4CH, axis, and
                   situs)
 RVOT:             Appears normal         Lower             Previously seen
                                          Extremities:
 LVOT:             Appears normal         Upper             Previously seen
                                          Extremities:

 Other:  Male gender. Heels and Left 5th digit previously visualized. Rt 5th
         digit appears normal.
Cervix Uterus Adnexa

 Cervix:       Not visualized (advanced GA >09wks)
Impression

 IUP at 33+2 weeks
 Normal interval anatomy; anatomic survey complete
 Normal amniotic fluid volume
 Appropriate interval growth with EFW at the 45th %tile
 BPP [DATE]
Recommendations

 Follow-up ultrasound for growth in 4 weeks
 Appts at [facility] [place] on [REDACTED] at 8 am

 MICKAYLA with us.  Please do not hesitate to

## 2014-01-31 ENCOUNTER — Encounter: Payer: Self-pay | Admitting: Obstetrics & Gynecology

## 2016-03-11 ENCOUNTER — Ambulatory Visit (INDEPENDENT_AMBULATORY_CARE_PROVIDER_SITE_OTHER): Payer: Self-pay | Admitting: Physician Assistant

## 2016-03-11 VITALS — BP 120/58 | HR 71 | Temp 98.5°F | Resp 16 | Ht 60.0 in | Wt 159.0 lb

## 2016-03-11 DIAGNOSIS — B889 Infestation, unspecified: Secondary | ICD-10-CM

## 2016-03-11 MED ORDER — PERMETHRIN 0.5 % AERO: INHALATION_SPRAY | 0 refills | Status: DC

## 2016-03-11 NOTE — Patient Instructions (Signed)
     IF you received an x-ray today, you will receive an invoice from [facility]. Please contact [facility] at [phone] with questions or concerns regarding your invoice.   IF you received labwork today, you will receive an invoice from [facility]. Please contact [facility] at [phone] with questions or concerns regarding your invoice.   Our billing staff will not be able to assist you with questions regarding bills from these companies.  You will be contacted with the lab results as soon as they are available. The fastest way to get your results is to activate your My Chart account. Instructions are located on the last page of this paperwork. If you have not heard from us regarding the results in 2 weeks, please contact this office.      

## 2016-03-11 NOTE — Progress Notes (Signed)
   03/11/2016 11:32 AM   DOB: 05-31-1980 / MRN: [id number]  SUBJECTIVE:  Regina Phelps is a 35 y.o. female presenting for itchy rash worse at night. The rash is about the wrist and forearms.  It started about 2 weeks ago.  No fever or joint pain.    ROS  Per HPI  The problem list and medications were reviewed and updated by myself where necessary and exist elsewhere in the encounter.   OBJECTIVE:  BP (!) 120/58 (BP Location: Right Arm, Patient Position: Sitting, Cuff Size: Small)   Pulse 71   Temp 98.5 F (36.9 C) (Oral)   Resp 16   Ht 5' (1.524 m)   Wt 159 lb (72.1 kg)   LMP 02/14/2016   SpO2 100%   BMI 31.05 kg/m   Physical Exam  Constitutional: Vital signs are normal.      No results found for this or any previous visit (from the past 72 hour(s)).  No results found.  ASSESSMENT AND PLAN  Reyne DumasCatalina was seen today for rash.  Diagnoses and all orders for this visit:  Mite infestation: Something is biting her and the father states he has seen bugs in the bed.  He will clean the house while she wears the cream.   -     pyrethrins-piperonyl butoxide 0.5 % bottle; Apply from the neck down and leave on for 8 hours then shower. Repeat in 7 days.    The patient is advised to call or return to clinic if she does not see an improvement in symptoms, or to seek the care of the closest emergency department if she worsens with the above plan.   Deliah BostonMichael Keifer Habib, MHS, PA-C [facility] 03/11/2016 11:32 AM

## 2016-05-28 ENCOUNTER — Ambulatory Visit (INDEPENDENT_AMBULATORY_CARE_PROVIDER_SITE_OTHER): Payer: Self-pay | Admitting: Family Medicine

## 2016-05-28 VITALS — BP 122/72 | HR 74 | Temp 98.4°F | Resp 17 | Ht 59.5 in | Wt 159.0 lb

## 2016-05-28 DIAGNOSIS — R1010 Upper abdominal pain, unspecified: Secondary | ICD-10-CM

## 2016-05-28 DIAGNOSIS — Z789 Other specified health status: Secondary | ICD-10-CM

## 2016-05-28 DIAGNOSIS — Z87442 Personal history of urinary calculi: Secondary | ICD-10-CM

## 2016-05-28 DIAGNOSIS — M549 Dorsalgia, unspecified: Secondary | ICD-10-CM

## 2016-05-28 DIAGNOSIS — H9201 Otalgia, right ear: Secondary | ICD-10-CM

## 2016-05-28 LAB — POC MICROSCOPIC URINALYSIS (UMFC): MUCUS RE: ABSENT

## 2016-05-28 LAB — POCT URINALYSIS DIP (MANUAL ENTRY)
BILIRUBIN UA: NEGATIVE
BILIRUBIN UA: NEGATIVE
Blood, UA: NEGATIVE
GLUCOSE UA: NEGATIVE
LEUKOCYTES UA: NEGATIVE
Nitrite, UA: NEGATIVE
Protein Ur, POC: NEGATIVE
Spec Grav, UA: 1.01
Urobilinogen, UA: 0.2
pH, UA: 7

## 2016-05-28 LAB — POCT URINE PREGNANCY: Preg Test, Ur: NEGATIVE

## 2016-05-28 MED ORDER — MELOXICAM 7.5 MG PO TABS: 7.5000 mg | ORAL_TABLET | Freq: Every day | ORAL | 0 refills | Status: AC | PRN

## 2016-05-28 NOTE — Patient Instructions (Addendum)
Es posible tiene poquito fluida atras su timpano de oido. Es posible de resfriado.  si dolor remanecer en 2 semanas, o si empeorse mas temprano - regrese.  Es posible que su dolor de espalda es de musculo de espalda. meloxicam cada dia (no tome ibuprofen con esta medicina), ejercisios, y regrese en proximo 2 semanas si no esta mejor. regrese mas temprano si empeorse.     Dolor de espalda en adultos (Back Pain, Adult) El dolor de espalda es muy frecuente en los adultos.La causa del dolor de espalda es rara vez peligrosa y Chief Technology Officerel dolor a menudo mejora con el Attleborotiempo.Es posible que se desconozca la causa de esta afeccin. Algunas causas comunes son las siguientes:  Distensin de los msculos o ligamentos que sostienen la columna vertebral.  ChiropractorDesgaste (degeneracin) de los discos vertebrales.  Artritis.  Lesiones directas en la espalda. En Yahoomuchas personas, el dolor de espalda es recurrente. Como rara vez es peligroso, las personas pueden aprender a Psychologist, clinicalmanejar esta afeccin por s mismas. INSTRUCCIONES PARA EL CUIDADO EN EL HOGAR Controle su dolor de espalda a fin de Public house managerdetectar algn cambio. Las siguientes indicaciones ayudarn a Architectural technologistaliviar cualquier molestia que pueda sentir:  Medical illustratorermanezca activo. Si permanece sentado o de pie en un mismo lugar durante mucho tiempo, se tensiona la espalda. No se siente, conduzca o permanezca de pie en un mismo lugar durante ms de 30 minutos seguidos. Realice caminatas cortas en superficies planas tan pronto como le sea posible.Trate de caminar un poco ms de Pharmacist, communitytiempo cada da.  Haga ejercicio regularmente como se lo haya indicado el mdico. El ejercicio ayuda a que su espalda se cure ms rpidamente. Tambin ayuda a prevenir futuras lesiones al Kimberly-Clarkmantener los msculos fuertes y flexibles.  No permanezca en la cama.Si hace reposo ms de 1 a 2 das, puede demorar su recuperacin.  Preste atencin a su cuerpo al inclinarse y levantarse. Las posiciones ms cmodas son las que  ejercen menos tensin en la espalda en recuperacin. Siempre use tcnicas apropiadas para levantar objetos, como por ejemplo:  Flexionar las rodillas.  Mantener la carga cerca del cuerpo.  No torcerse.  Encuentre una posicin cmoda para dormir. Use un colchn firme y recustese de costado con las rodillas ligeramente flexionadas. Si se recuesta Fisher Scientificsobre la espalda, coloque una almohada debajo de las rodillas.  Evite sentir ansiedad o estrs.El estrs aumenta la tensin muscular y puede empeorar el dolor de espalda.Es importante reconocer si se siente ansioso o estresado y aprender maneras de controlarlo, por ejemplo haciendo ejercicio.  Tome los medicamentos solamente como se lo haya indicado el mdico. Los medicamentos de venta libre para Engineer, materialsaliviar el dolor y la inflamacin a menudo son los ms eficaces.El mdico puede recetarle relajantes musculares.Estos medicamentos ayudan a Primary school teachercalmar el dolor de modo que pueda reanudar ms rpidamente sus actividades normales y el ejercicio saludable.  Aplique hielo sobre la zona lesionada.  Ponga el hielo en una bolsa plstica.  Coloque una toalla entre la piel y la bolsa de hielo.  Deje el hielo durante 20minutos, 2 a 3veces por da, durante los primeros 2 o 3das. Despus de eso, puede alternar el hielo y el calor para reducir Chief Technology Officerel dolor y los espasmos.  Mantenga un peso saludable. El exceso de peso ejerce presin adicional sobre la espalda y hace que resulte difcil mantener una buena Warrenpostura. SOLICITE ATENCIN MDICA SI:  Siente un dolor que no se alivia con reposo o medicamentos.  Siente mucho dolor que se extiende a las piernas o los  glteos.  El dolor no mejora en una semana.  Siente dolor por la noche.  Pierde peso.  Siente escalofros o fiebre. SOLICITE ATENCIN MDICA DE INMEDIATO SI:  Tiene nuevos problemas para controlar la vejiga o los intestinos.  Siente debilidad o adormecimiento inusuales en los brazos o en las  piernas.  Siente nuseas o vmitos.  Siente dolor abdominal.  Siente que va a desmayarse. Esta informacin no tiene Theme park manager el consejo del mdico. Asegrese de hacerle al mdico cualquier pregunta que tenga. Document Released: 03/18/2005 Document Revised: 04/08/2014 Document Reviewed: 07/20/2013 Elsevier Interactive Patient Education  2017 [employer].   IF you received an x-ray today, you will receive an invoice from [facility]. Please contact [facility] at [phone] with questions or concerns regarding your invoice.   IF you received labwork today, you will receive an invoice from [facility]. Please contact [facility] at [phone] with questions or concerns regarding your invoice.   Our billing staff will not be able to assist you with questions regarding bills from these companies.  You will be contacted with the lab results as soon as they are available. The fastest way to get your results is to activate your My Chart account. Instructions are located on the last page of this paperwork. If you have not heard from [place] regarding the results in 2 weeks, please contact this office.

## 2016-05-28 NOTE — Progress Notes (Signed)
Subjective:  By signing my name below, I, Regina Phelps, attest that this documentation has been prepared under the direction and in the presence of Regina Flood, MD Electronically Signed: Charline Bills, ED Scribe 05/28/2016 at 12:13 PM.   Patient ID: Regina Phelps, female    DOB: [birth year], 36 y.o.   MRN: [id number]  Chief Complaint  Patient presents with  . Back Pain   HPI Regina Phelps is a 36 y.o. female with a h/o mitral valve stenosis, CHF, valvuloplasty, nephrolithiasis, who presents to Primary Care at [facility] complaining of mid upper back pain onset 05/13/16. No known injury. Pt states that back pain is bilateral and occasionally wraps to the front of her abdomen. She reports some pain throughout the day but states that intensity comes and goes. Pt reports increased pain at night but states that pain improves during the day. She has tried ibuprofen occasionally with temporary relief. She has also noticed a rare slight cough 2 weeks ago which has improved. Pt denies fever, urinary frequency, urinary urgency, dysuria, hematuria, chest pain, sob, nausea, vomiting. Her LNMP was 05/16/16, lasted 6 days; no contraception.   R Ear Pt reports occasional pain in her right ear. She denies changes in her hearing.   Patient Active Problem List   Diagnosis Date Noted  . Severe mitral valve stenosis complicating pregnancy, antepartum 06/11/2012  . Other cardiovascular diseases of mother, antepartum 05/29/2012  . History of gestational diabetes in prior pregnancy, currently pregnant 05/28/2012  . Unspecified high-risk pregnancy 05/28/2012  . Nephrolithiasis 05/28/2012  . Gonorrhea complicating pregnancy in second trimester 05/28/2012  . History of congestive heart failure 05/28/2012  . History of pulmonary hypertension 05/28/2012  . Thrombocytopenia (HCC) 05/28/2012  . Arrhythmia 08/12/2010  . Mitral valve stenosis, moderate 08/12/2010   Past Medical History:    Diagnosis Date  . Mitral stenosis   . Palpitations   . Pulmonary hypertension    Improved following mitral valvuloplasty   Past Surgical History:  Procedure Laterality Date  . CARDIAC VALVE SURGERY    . MITRAL VALVULOPLASTY     No Known Allergies Prior to Admission medications   Medication Sig Start Date End Date Taking? Authorizing Provider  norethindrone (MICRONOR,CAMILA,ERRIN) 0.35 MG tablet Take 1 tablet (0.35 mg total) by mouth daily. Patient not taking: Reported on 03/11/2016 10/15/12   Tereso Newcomer, MD  prenatal vitamin w/FE, FA (PRENATAL 1 + 1) 27-1 MG TABS Take 1 tablet by mouth daily.    Historical Provider, MD  pyrethrins-piperonyl butoxide 0.5 % bottle Apply from the neck down and leave on for 8 hours then shower. Repeat in 7 days. Patient not taking: Reported on 05/28/2016 03/11/16   [name], PA-C   Social History   Social History  . Marital status: Married    Spouse name: N/A  . Number of children: N/A  . Years of education: N/A   Occupational History  . Not on file.   Social History Main Topics  . Smoking status: Never Smoker  . Smokeless tobacco: Never Used  . Alcohol use No  . Drug use: No  . Sexual activity: No   Other Topics Concern  . Not on file   Social History Narrative  . No narrative on file   Review of Systems  Constitutional: Negative for fever.  HENT: Positive for ear pain. Negative for hearing loss.   Respiratory: Negative for shortness of breath.   Cardiovascular: Negative for chest pain.  Gastrointestinal: Negative for nausea and  vomiting.  Genitourinary: Negative for dysuria, frequency, hematuria and urgency.  Musculoskeletal: Positive for back pain.      Objective:   Physical Exam  Constitutional: She is oriented to person, place, and time. She appears well-developed and well-nourished. No distress.  HENT:  Head: Normocephalic and atraumatic.  Right Ear: Ear canal normal. Tympanic membrane is not erythematous. No  middle ear effusion.  Eyes: Conjunctivae and EOM are normal.  Neck: Neck supple. No tracheal deviation present.  Cardiovascular: Normal rate.   Pulmonary/Chest: Effort normal. No respiratory distress.  Abdominal: Soft. There is no tenderness.  Musculoskeletal: Normal range of motion.  No focal tenderness on mid back but she reports that when she does have pain it is in the mid back paraspinal muscles. Discomfort with R lateral flexion but equal ROM. No midline bony tenderness. She describes the area of pain as occasionally wrapping around to the front to mid sternal area but nontender on exam.   Lymphadenopathy:    She has no cervical adenopathy.  Neurological: She is alert and oriented to person, place, and time.  Reflex Scores:      Patellar reflexes are 2+ on the right side and 2+ on the left side.      Achilles reflexes are 2+ on the right side and 2+ on the left side. Skin: Skin is warm and dry.  Psychiatric: She has a normal mood and affect. Her behavior is normal.  Nursing note and vitals reviewed.  Vitals:   05/28/16 1133  BP: 122/72  Pulse: 74  Resp: 17  Temp: 98.4 F (36.9 C)  TempSrc: Oral  SpO2: 100%  Weight: 159 lb (72.1 kg)  Height: 4' 11.5" (1.511 m)   Results for orders placed or performed in visit on 05/28/16  POCT urinalysis dipstick  Result Value Ref Range   Color, UA yellow yellow   Clarity, UA clear clear   Glucose, UA negative negative   Bilirubin, UA negative negative   Ketones, POC UA negative negative   Spec Grav, UA 1.010    Blood, UA negative negative   pH, UA 7.0    Protein Ur, POC negative negative   Urobilinogen, UA 0.2    Nitrite, UA Negative Negative   Leukocytes, UA Negative Negative  POCT urine pregnancy  Result Value Ref Range   Preg Test, Ur Negative Negative  POCT Microscopic Urinalysis ([facility])  Result Value Ref Range   WBC,UR,HPF,POC None None WBC/hpf   RBC,UR,HPF,POC None None RBC/hpf   Bacteria None None, Too numerous to count    Mucus Absent Absent   Epithelial Cells, UR Per Microscopy Few (A) None, Too numerous to count cells/hpf      Assessment & Plan:    Regina Phelps is a 36 y.o. female Mid back pain - Plan: POCT urinalysis dipstick, POCT urine pregnancy, POCT Microscopic Urinalysis ([facility]), meloxicam (MOBIC) 7.5 MG tablet History of nephrolithiasis - Plan: POCT urinalysis dipstick, POCT Microscopic Urinalysis ([facility])  -Like a mechanical low back pain. History of nephrolithiasis, but not typical presentation and urinalysis without RBCs.  -Meloxicam 7.5 mg daily, symptomatic care and RTC precautions discussed.  Right ear pain  -Slightly dull on exam, no apparent erythema or acute infection. May have component of serous otitis from previous upper respiratory infection, but should be improving within the next week or 2. If any worsening pain, decreased hearing, or other worsening, recheck sooner.  Upper abdominal pain - Plan: POCT urine pregnancy  - Radiating pain from back pain at times. Reassuring  exam, reassuring urinalysis. HCG negative. RTC precautions if persists or worsens.  Language barrier  -Spanish and English spoken with understanding expressed. Declined Radiation protection practitionervideo translator.   Meds ordered this encounter  Medications  . meloxicam (MOBIC) 7.5 MG tablet    Sig: Take 1 tablet (7.5 mg total) by mouth daily as needed for pain. Label in spanish    Dispense:  30 tablet    Refill:  0   Patient Instructions    Es posible tiene poquito fluida atras su timpano de oido. Es posible de resfriado.  si dolor remanecer en 2 semanas, o si empeorse mas temprano - regrese.  Es posible que su dolor de espalda es de musculo de espalda. meloxicam cada dia (no tome ibuprofen con esta medicina), ejercisios, y regrese en proximo 2 semanas si no esta mejor. regrese mas temprano si empeorse.     Dolor de espalda en adultos (Back Pain, Adult) El dolor de espalda es muy frecuente en los adultos.La causa del  dolor de espalda es rara vez peligrosa y Chief Technology Officerel dolor a menudo mejora con el Butler Beachtiempo.Es posible que se desconozca la causa de esta afeccin. Algunas causas comunes son las siguientes:  Distensin de los msculos o ligamentos que sostienen la columna vertebral.  ChiropractorDesgaste (degeneracin) de los discos vertebrales.  Artritis.  Lesiones directas en la espalda. En [place] personas, el dolor de espalda es recurrente. Como rara vez es peligroso, las personas pueden aprender a Psychologist, clinicalmanejar esta afeccin por s mismas. INSTRUCCIONES PARA EL CUIDADO EN EL HOGAR Controle su dolor de espalda a fin de Public house managerdetectar algn cambio. Las siguientes indicaciones ayudarn a Architectural technologistaliviar cualquier molestia que pueda sentir:  Medical illustratorermanezca activo. Si permanece sentado o de pie en un mismo lugar durante mucho tiempo, se tensiona la espalda. No se siente, conduzca o permanezca de pie en un mismo lugar durante ms de 30 minutos seguidos. Realice caminatas cortas en superficies planas tan pronto como le sea posible.Trate de caminar un poco ms de Pharmacist, communitytiempo cada da.  Haga ejercicio regularmente como se lo haya indicado el mdico. El ejercicio ayuda a que su espalda se cure ms rpidamente. Tambin ayuda a prevenir futuras lesiones al Kimberly-Clarkmantener los msculos fuertes y flexibles.  No permanezca en la cama.Si hace reposo ms de 1 a 2 das, puede demorar su recuperacin.  Preste atencin a su cuerpo al inclinarse y levantarse. Las posiciones ms cmodas son las que ejercen menos tensin en la espalda en recuperacin. Siempre use tcnicas apropiadas para levantar objetos, como por ejemplo:  Flexionar las rodillas.  Mantener la carga cerca del cuerpo.  No torcerse.  Encuentre una posicin cmoda para dormir. Use un colchn firme y recustese de costado con las rodillas ligeramente flexionadas. Si se recuesta Fisher Scientificsobre la espalda, coloque una almohada debajo de las rodillas.  Evite sentir ansiedad o estrs.El estrs aumenta la tensin muscular y  puede empeorar el dolor de espalda.Es importante reconocer si se siente ansioso o estresado y aprender maneras de controlarlo, por ejemplo haciendo ejercicio.  Tome los medicamentos solamente como se lo haya indicado el mdico. Los medicamentos de venta libre para Engineer, materialsaliviar el dolor y la inflamacin a menudo son los ms eficaces.El mdico puede recetarle relajantes musculares.Estos medicamentos ayudan a Primary school teachercalmar el dolor de modo que pueda reanudar ms rpidamente sus actividades normales y el ejercicio saludable.  Aplique hielo sobre la zona lesionada.  Ponga el hielo en una bolsa plstica.  Coloque una toalla entre la piel y la bolsa de hielo.  Deje el hielo durante  , 2 a 3veces por da, durante los primeros 2 o 3das. Despus de eso, puede alternar el hielo y el calor para reducir Chief Technology Officer y los espasmos.  Mantenga un peso saludable. El exceso de peso ejerce presin adicional sobre la espalda y hace que resulte difcil mantener una buena [place]. SOLICITE ATENCIN MDICA SI:  Siente un dolor que no se alivia con reposo o medicamentos.  Siente mucho dolor que se extiende a las piernas o los glteos.  El dolor no mejora en una semana.  Siente dolor por la noche.  Pierde peso.  Siente escalofros o fiebre. SOLICITE ATENCIN MDICA DE INMEDIATO SI:  Tiene nuevos problemas para controlar la vejiga o los intestinos.  Siente debilidad o adormecimiento inusuales en los brazos o en las piernas.  Siente nuseas o vmitos.  Siente dolor abdominal.  Siente que va a desmayarse. Esta informacin no tiene Theme park manager el consejo del mdico. Asegrese de hacerle al mdico cualquier pregunta que tenga. Document Released: 03/18/2005 Document Revised: 04/08/2014 Document Reviewed: 07/20/2013 Elsevier Interactive Patient Education  2017 [employer].   IF you received an x-ray today, you will receive an invoice from [facility]. Please contact [facility]  at [phone] with questions or concerns regarding your invoice.   IF you received labwork today, you will receive an invoice from [place]. Please contact [facility] at [phone] with questions or concerns regarding your invoice.   Our billing staff will not be able to assist you with questions regarding bills from these companies.  You will be contacted with the lab results as soon as they are available. The fastest way to get your results is to activate your My Chart account. Instructions are located on the last page of this paperwork. If you have not heard from [place] regarding the results in 2 weeks, please contact this office.       I personally performed the services described in this documentation, which was scribed in my presence. The recorded information has been reviewed and considered for accuracy and completeness, addended by me as needed, and agree with information above.  Signed,   Meredith Staggers, MD Primary Care at [facility].  05/29/16 9:07 PM

## 2017-01-14 ENCOUNTER — Ambulatory Visit (INDEPENDENT_AMBULATORY_CARE_PROVIDER_SITE_OTHER): Payer: Self-pay | Admitting: Emergency Medicine

## 2017-01-14 DIAGNOSIS — Z23 Encounter for immunization: Secondary | ICD-10-CM

## 2018-12-26 ENCOUNTER — Other Ambulatory Visit: Payer: Self-pay

## 2018-12-26 ENCOUNTER — Telehealth: Payer: Self-pay | Admitting: General Practice

## 2018-12-26 DIAGNOSIS — Z20822 Contact with and (suspected) exposure to covid-19: Secondary | ICD-10-CM

## 2018-12-26 NOTE — Telephone Encounter (Signed)
Patient calling in order to access MyChart account. SSN updated in chart and new link to access MyChart sent to patient via text. No other concerns voiced at this time.

## 2018-12-27 LAB — NOVEL CORONAVIRUS, NAA: SARS-CoV-2, NAA: NOT DETECTED

## 2021-02-06 ENCOUNTER — Telehealth: Payer: Self-pay | Admitting: Physician Assistant

## 2021-02-06 NOTE — Telephone Encounter (Signed)
Scheduled appt per 11/4 referral. Used interpreter services to schedule appt. Pt is aware of appt date and time.

## 2021-02-27 ENCOUNTER — Inpatient Hospital Stay: Payer: Self-pay | Attending: Physician Assistant | Admitting: Physician Assistant

## 2021-02-27 ENCOUNTER — Encounter: Payer: Self-pay | Admitting: Physician Assistant

## 2021-02-27 ENCOUNTER — Inpatient Hospital Stay: Payer: Self-pay

## 2021-02-27 ENCOUNTER — Other Ambulatory Visit: Payer: Self-pay

## 2021-02-27 VITALS — BP 108/57 | HR 73 | Temp 97.2°F | Resp 20 | Wt 166.4 lb

## 2021-02-27 DIAGNOSIS — D696 Thrombocytopenia, unspecified: Secondary | ICD-10-CM

## 2021-02-27 DIAGNOSIS — D693 Immune thrombocytopenic purpura: Secondary | ICD-10-CM | POA: Insufficient documentation

## 2021-02-27 LAB — HEPATITIS B SURFACE ANTIBODY,QUALITATIVE: Hep B S Ab: NONREACTIVE

## 2021-02-27 LAB — CMP (CANCER CENTER ONLY)
ALT: 19 U/L (ref 0–44)
AST: 21 U/L (ref 15–41)
Albumin: 4.2 g/dL (ref 3.5–5.0)
Alkaline Phosphatase: 122 U/L (ref 38–126)
Anion gap: 5 (ref 5–15)
BUN: 11 mg/dL (ref 6–20)
CO2: 25 mmol/L (ref 22–32)
Calcium: 9.2 mg/dL (ref 8.9–10.3)
Chloride: 107 mmol/L (ref 98–111)
Creatinine: 0.61 mg/dL (ref 0.44–1.00)
GFR, Estimated: >60 mL/min (ref >60)
Glucose, Bld: 98 mg/dL (ref 70–99)
Potassium: 3.9 mmol/L (ref 3.5–5.1)
Sodium: 137 mmol/L (ref 135–145)
Total Bilirubin: 0.7 mg/dL (ref 0.3–1.2)
Total Protein: 7.8 g/dL (ref 6.5–8.1)

## 2021-02-27 LAB — CBC WITH DIFFERENTIAL (CANCER CENTER ONLY)
Abs Immature Granulocytes: 0.02 10*3/uL (ref 0.00–0.07)
Basophils Absolute: 0 10*3/uL (ref 0.0–0.1)
Basophils Relative: 1 %
Eosinophils Absolute: 0.1 10*3/uL (ref 0.0–0.5)
Eosinophils Relative: 1 %
HCT: 36 % (ref 36.0–46.0)
Hemoglobin: 11.6 g/dL — ABNORMAL LOW (ref 12.0–15.0)
Immature Granulocytes: 0 %
Lymphocytes Relative: 23 %
Lymphs Abs: 1.5 10*3/uL (ref 0.7–4.0)
MCH: 26.3 pg (ref 26.0–34.0)
MCHC: 32.2 g/dL (ref 30.0–36.0)
MCV: 81.6 fL (ref 80.0–100.0)
Monocytes Absolute: 0.6 10*3/uL (ref 0.1–1.0)
Monocytes Relative: 9 %
Neutro Abs: 4.2 10*3/uL (ref 1.7–7.7)
Neutrophils Relative %: 66 %
Platelet Count: 156 10*3/uL (ref 150–400)
RBC: 4.41 MIL/uL (ref 3.87–5.11)
RDW: 16 % — ABNORMAL HIGH (ref 11.5–15.5)
WBC Count: 6.3 10*3/uL (ref 4.0–10.5)
nRBC: 0 % (ref 0.0–0.2)

## 2021-02-27 LAB — HEPATITIS B CORE ANTIBODY, TOTAL: Hep B Core Total Ab: NONREACTIVE

## 2021-02-27 LAB — HEPATITIS B SURFACE ANTIGEN: Hepatitis B Surface Ag: NONREACTIVE

## 2021-02-27 LAB — IMMATURE PLATELET FRACTION: Immature Platelet Fraction: 18.8 % — ABNORMAL HIGH (ref 1.2–8.6)

## 2021-02-27 LAB — HIV ANTIBODY (ROUTINE TESTING W REFLEX): HIV Screen 4th Generation wRfx: NONREACTIVE

## 2021-02-27 LAB — VITAMIN B12: Vitamin B-12: 363 pg/mL (ref 180–914)

## 2021-02-27 LAB — HEPATITIS C ANTIBODY: HCV Ab: NONREACTIVE

## 2021-02-27 LAB — FOLATE: Folate: 16.8 ng/mL (ref >5.9)

## 2021-02-27 NOTE — Progress Notes (Signed)
[place] [phone]   [phone] [place] NOTE  Patient Care Team: Patient, No Pcp Per (Inactive) as PCP - General (General Practice)  Hematological/Oncological History 1) Labs from PCP, Michail Jewels NP from [facility] -01/24/2021: WBC 5.6, Hgb 13.7, Plt 133 (L) -01/31/2021: WBC 6.8, Hgb 13.1, Plt 119 (L)  2) 02/27/2021: Establish care at [facility] hematology/oncology   CHIEF COMPLAINTS/PURPOSE OF CONSULTATION:  Chronic thrombocytopenia  HISTORY OF PRESENTING ILLNESS:  Shamere Delarosa-Villafane 40 y.o. female with medical history significant for mitral stenosis status post mitral valvuloplasty.  She is accompanied by her husband for this visit.  On review of the previous records, Ms. Down has history of thrombocytopenia as far back as 2014 and received treatment with steroids.  Her platelets were 27K back in June 2014 during her last pregnancy.  More recently, her platelet level was 119K on 01/31/2021.   On exam today, Ms. Delarosa-Villafane reports her energy levels are overall stable.  She is able to complete all her daily activities on her own.  She has a good appetite without any recent weight changes.  She denies nausea, vomiting or abdominal pain.  Patient experiences occasional episodes of constipation but otherwise her bowel habits are unchanged.  She denies easy bruising or signs of bleeding.  This includes hematochezia, melena, hematuria, epistaxis and gingival bleeding.  Patient reports intermittent episodes of dizziness without any syncopal episodes.  She denies any fevers, chills, night sweats, shortness of breath, chest pain or cough.  She has no other complaints.  Rest of the 10 point ROS is below.  MEDICAL HISTORY:  Past Medical History:  Diagnosis Date   Gallstone    Mitral stenosis    Palpitations    Pulmonary hypertension (HCC)    Improved following mitral valvuloplasty    SURGICAL  HISTORY: Past Surgical History:  Procedure Laterality Date   CARDIAC VALVE SURGERY     CHOLECYSTECTOMY     MITRAL VALVULOPLASTY     OVARIAN CYST REMOVAL      SOCIAL HISTORY: Social History   Socioeconomic History   Marital status: Married    Spouse name: Not on file   Number of children: Not on file   Years of education: Not on file   Highest education level: Not on file  Occupational History   Not on file  Tobacco Use   Smoking status: Never   Smokeless tobacco: Never  Substance and Sexual Activity   Alcohol use: No   Drug use: No   Sexual activity: Never  Other Topics Concern   Not on file  Social History Narrative   Not on file   Social Determinants of Health   Financial Resource Strain: Not on file  Food Insecurity: Not on file  Transportation Needs: Not on file  Physical Activity: Not on file  Stress: Not on file  Social Connections: Not on file  Intimate Partner Violence: Not on file    FAMILY HISTORY: Family History  Problem Relation Age of Onset   Diabetes Mother    Diabetes Father     ALLERGIES:  has No Known Allergies.  MEDICATIONS:  Current Outpatient Medications  Medication Sig Dispense Refill   meloxicam (MOBIC) 7.5 MG tablet Take 1 tablet (7.5 mg total) by mouth daily as needed for pain. Label in spanish (Patient not taking: Reported on 02/27/2021) 30 tablet 0   prenatal vitamin w/FE, FA (PRENATAL 1 + 1) 27-1 MG TABS Take 1 tablet by mouth daily. (Patient not  taking: Reported on 02/27/2021)     No current facility-administered medications for this visit.    REVIEW OF SYSTEMS:   Constitutional: ( - ) fevers, ( - )  chills , ( - ) night sweats Eyes: ( - ) blurriness of vision, ( - ) double vision, ( - ) watery eyes Ears, nose, mouth, throat, and face: ( - ) mucositis, ( - ) sore throat Respiratory: ( - ) cough, ( - ) dyspnea, ( - ) wheezes Cardiovascular: ( - ) palpitation, ( - ) chest discomfort, ( - ) lower extremity  swelling Gastrointestinal:  ( - ) nausea, ( - ) heartburn, ( - ) change in bowel habits Skin: ( - ) abnormal skin rashes Lymphatics: ( - ) new lymphadenopathy, ( - ) easy bruising Neurological: ( - ) numbness, ( - ) tingling, ( - ) new weaknesses Behavioral/Psych: ( - ) mood change, ( - ) new changes  All other systems were reviewed with the patient and are negative.  PHYSICAL EXAMINATION: ECOG PERFORMANCE STATUS: 0 - Asymptomatic  Vitals:   02/27/21 1102  BP: (!) 108/57  Pulse: 73  Resp: 20  Temp: (!) 97.2 F (36.2 C)  SpO2: 100%   Filed Weights   02/27/21 1102  Weight: 166 lb 6.4 oz (75.5 kg)    GENERAL: well appearing female in NAD  SKIN: skin color, texture, turgor are normal, no rashes or significant lesions EYES: conjunctiva are pink and non-injected, sclera clear OROPHARYNX: no exudate, no erythema; lips, buccal mucosa, and tongue normal  NECK: supple, non-tender LYMPH:  no palpable lymphadenopathy in the cervical, axillary or supraclavicular lymph nodes.  LUNGS: clear to auscultation and percussion with normal breathing effort HEART: regular rate & rhythm and no murmurs and no lower extremity edema ABDOMEN: soft, non-tender, non-distended, normal bowel sounds Musculoskeletal: no cyanosis of digits and no clubbing  PSYCH: alert & oriented x 3, fluent speech NEURO: no focal motor/sensory deficits  LABORATORY DATA:  I have reviewed the data as listed CBC Latest Ref Rng & Units 02/27/2021 07/16/2012 05/18/2012  WBC 4.0 - 10.5 K/uL 6.3 6.9 -  Hemoglobin 12.0 - 15.0 g/dL 11.6(L) 12.0 12.8  Hematocrit 36.0 - 46.0 % 36.0 35.7(L) 38  Platelets 150 - 400 K/uL 156 81(L) 72    CMP Latest Ref Rng & Units 07/16/2012 08/07/2010  Glucose 70 - 104 mg/dL 91 89  BUN 6 - 23 mg/dL - 6  Creatinine 0.4 - 1.2 mg/dL - 0.51  Sodium 135 - 145 mEq/L - 139  Potassium 3.5 - 5.1 mEq/L - 3.6  Chloride 96 - 112 mEq/L - 103  CO2 19 - 32 mEq/L - 26  Calcium 8.4 - 10.5 mg/dL - 9.5     ASSESSMENT & PLAN Melia Hopes is a 40 y.o. female who presents to the clinic for evaluation for thrombocytopenia.  Per review of outside records, patient has suffered from chronic thrombocytopenia for several years as far back as 2014.  She has received steroid therapy during her most recent pregnancy in 2014 when her platelets were less than 30,000.   I reviewed patient's most recent labs from her PCP, Michail Jewels NP, that shows her platelet count was119,000. I reviewed potential etiologies for thrombocytopenia including liver disease, splenomegaly, infectious processes, nutritional anemias, immune mediated and bone marrow disorders.   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  Generally with ITP, we will monitor counts closely and treat with  steroids if platelets drop less than 30,000 or patient has signs of active bleeding.  We will rule out other causes for thrombocytopenia with serologic evaluation today.  We will check CBC, CMP, immature platelet fraction, Vitamin B12, methylmalonic acid , homocysteine, folate,Hepatitis B and C serologies and HIV serology.     #Thrombocytopenia, likely ITP: --Labs today to rule out other etiologies. Check CBC, CMP, immature platelet fraction, Vitamin B12, methylmalonic acid , homocysteine, folate,Hepatitis B and C serologies and HIV serology.   --Most recent CT Scan from 06/21/2018 shows no evidence of liver disease or splenomegaly.  --Plan to treat with dexamethasone 40 mg x 4 days if Plts are less than 30,000 or patient reports signs of active bleeding.  --Strict precautions given for bleeding.  --Labs in 3 months and office visit/labs in 6 months.    Orders Placed This Encounter  Procedures   Immature Platelet Fraction    Standing Status:   Future    Number of Occurrences:   1    Standing Expiration Date:   02/27/2022   CBC with Differential ([facility]  Only)    Standing Status:   Future    Number of Occurrences:   1    Standing Expiration Date:   02/27/2022   CMP ([facility] only)    Standing Status:   Future    Number of Occurrences:   1    Standing Expiration Date:   02/27/2022   Hepatitis C antibody    Standing Status:   Future    Number of Occurrences:   1    Standing Expiration Date:   02/27/2022   Hepatitis B surface antigen    Standing Status:   Future    Number of Occurrences:   1    Standing Expiration Date:   02/27/2022   Hepatitis B surface antibody    Standing Status:   Future    Number of Occurrences:   1    Standing Expiration Date:   02/27/2022   Hepatitis B core antibody, total    Standing Status:   Future    Number of Occurrences:   1    Standing Expiration Date:   02/27/2022   HIV antibody (with reflex)    Standing Status:   Future    Number of Occurrences:   1    Standing Expiration Date:   02/27/2022   Vitamin B12    Standing Status:   Future    Number of Occurrences:   1    Standing Expiration Date:   02/27/2022   Methylmalonic acid, serum    Standing Status:   Future    Number of Occurrences:   1    Standing Expiration Date:   02/27/2022   Folate, Serum    Standing Status:   Future    Number of Occurrences:   1    Standing Expiration Date:   02/27/2022   Homocysteine, serum    Standing Status:   Future    Number of Occurrences:   1    Standing Expiration Date:   02/27/2022    All questions were answered. The patient knows to call the clinic with any problems, questions or concerns.  I have spent a total of 60 minutes minutes of face-to-face and non-face-to-face time, preparing to see the patient, obtaining and/or reviewing separately obtained history, performing a medically appropriate examination, counseling and educating the patient, ordering tests, documenting clinical information in the electronic health record,and care coordination.   Murray Hodgkins  Debbe Odea Department of  Hematology/Oncology [place] at [facility] Phone: [phone]  Patient was seen with Dr. Lorenso Courier.   I have read the above note and personally examined the patient. I agree with the assessment and plan as noted above.  Briefly Ms. Philbert Riser is a 40 year old female who presents for evaluation of thrombocytopenia.  She has had platelet counts as low as 81,000 on our records from 07/16/2012, but has also been reportedly as low as 27,000.  At this time the etiology is unclear and we will pursue a full work-up to include nutritional studies, and viral serologies.  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  Could consider bone marrow biopsy and initiation of steroid therapy if the patient were to drop below a platelet count of 30.  In order to monitor her platelet counts we recommend that she return to clinic in 6 months time or sooner if she would develop any bleeding, bruising, dark stools, or drop counts noted by other providers.   Ledell Peoples, MD Department of Hematology/Oncology [place] at [facility] Phone: [phone] Pager: [phone] Email: [email or web address]

## 2021-02-28 LAB — HOMOCYSTEINE: Homocysteine: 6.5 umol/L (ref 0.0–14.5)

## 2021-03-01 ENCOUNTER — Telehealth: Payer: Self-pay | Admitting: Physician Assistant

## 2021-03-01 LAB — METHYLMALONIC ACID, SERUM: Methylmalonic Acid, Quantitative: 69 nmol/L (ref 0–378)

## 2021-03-01 NOTE — Telephone Encounter (Signed)
I called Regina Phelps using a Spanish interpreter.  I reviewed the lab results from 02/27/2021.  Platelet level was within normal limits.  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  There is no evidence of nutritional deficiencies, hepatitis B, hepatitis C or HIV.The recommendation is to monitor with serial labs.  We will see the patient back in 3 months for labs and a follow-up visit in 6 months.Strict precautions were given for bleeding.     Patient expressed understanding and satisfaction with the plan provided

## 2021-05-29 ENCOUNTER — Inpatient Hospital Stay: Payer: Self-pay | Attending: Physician Assistant

## 2021-08-28 ENCOUNTER — Inpatient Hospital Stay: Payer: Self-pay | Attending: Physician Assistant

## 2021-08-28 ENCOUNTER — Inpatient Hospital Stay: Payer: Self-pay | Admitting: Physician Assistant
